# Patient Record
Sex: Female | Born: 1985 | Hispanic: No | Smoking: Never smoker
Health system: Southern US, Community
[De-identification: ages and names within clinical notes are randomized; demographics above are authoritative.]

## PROBLEM LIST (undated history)

## (undated) DIAGNOSIS — Z8619 Personal history of other infectious and parasitic diseases: Secondary | ICD-10-CM

## (undated) DIAGNOSIS — B009 Herpesviral infection, unspecified: Secondary | ICD-10-CM

## (undated) DIAGNOSIS — B379 Candidiasis, unspecified: Secondary | ICD-10-CM

## (undated) DIAGNOSIS — J45909 Unspecified asthma, uncomplicated: Secondary | ICD-10-CM

## (undated) DIAGNOSIS — N898 Other specified noninflammatory disorders of vagina: Principal | ICD-10-CM

## (undated) DIAGNOSIS — F329 Major depressive disorder, single episode, unspecified: Secondary | ICD-10-CM

## (undated) DIAGNOSIS — Z8742 Personal history of other diseases of the female genital tract: Secondary | ICD-10-CM

## (undated) DIAGNOSIS — N200 Calculus of kidney: Secondary | ICD-10-CM

## (undated) DIAGNOSIS — E785 Hyperlipidemia, unspecified: Secondary | ICD-10-CM

## (undated) DIAGNOSIS — E282 Polycystic ovarian syndrome: Secondary | ICD-10-CM

## (undated) DIAGNOSIS — R51 Headache: Secondary | ICD-10-CM

## (undated) DIAGNOSIS — E119 Type 2 diabetes mellitus without complications: Secondary | ICD-10-CM

## (undated) HISTORY — PX: INNER EAR SURGERY: SHX679

## (undated) HISTORY — DX: Hyperlipidemia, unspecified: E78.5

## (undated) HISTORY — DX: Personal history of other infectious and parasitic diseases: Z86.19

## (undated) HISTORY — PX: ORIF WRIST FRACTURE: SHX2133

## (undated) HISTORY — DX: Headache: R51

## (undated) HISTORY — PX: ARM HARDWARE REMOVAL: SUR1122

## (undated) HISTORY — DX: Unspecified asthma, uncomplicated: J45.909

## (undated) HISTORY — DX: Type 2 diabetes mellitus without complications: E11.9

## (undated) HISTORY — DX: Personal history of other diseases of the female genital tract: Z87.42

## (undated) HISTORY — DX: Major depressive disorder, single episode, unspecified: F32.9

## (undated) HISTORY — DX: Other specified noninflammatory disorders of vagina: N89.8

## (undated) HISTORY — DX: Candidiasis, unspecified: B37.9

---

## 2008-06-12 ENCOUNTER — Ambulatory Visit: Payer: Self-pay | Admitting: Internal Medicine

## 2008-06-12 DIAGNOSIS — A6000 Herpesviral infection of urogenital system, unspecified: Secondary | ICD-10-CM | POA: Insufficient documentation

## 2008-06-12 DIAGNOSIS — H9190 Unspecified hearing loss, unspecified ear: Secondary | ICD-10-CM | POA: Insufficient documentation

## 2008-06-12 DIAGNOSIS — J309 Allergic rhinitis, unspecified: Secondary | ICD-10-CM | POA: Insufficient documentation

## 2008-06-12 DIAGNOSIS — Z87442 Personal history of urinary calculi: Secondary | ICD-10-CM

## 2008-06-12 LAB — CONVERTED CEMR LAB
Bilirubin Urine: NEGATIVE
Glucose, Urine, Semiquant: NEGATIVE
Protein, U semiquant: 30
Urobilinogen, UA: 0.2
pH: 6.5

## 2008-06-16 LAB — CONVERTED CEMR LAB
Cholesterol: 233 mg/dL — ABNORMAL HIGH (ref 0–200)
HDL: 60 mg/dL (ref >39)
Total CHOL/HDL Ratio: 3.9
VLDL: 49 mg/dL — ABNORMAL HIGH (ref 0–40)

## 2008-06-17 ENCOUNTER — Other Ambulatory Visit: Admission: RE | Admit: 2008-06-17 | Discharge: 2008-06-17 | Payer: Self-pay | Admitting: Internal Medicine

## 2008-06-17 ENCOUNTER — Encounter (INDEPENDENT_AMBULATORY_CARE_PROVIDER_SITE_OTHER): Payer: Self-pay | Admitting: Internal Medicine

## 2008-06-17 ENCOUNTER — Ambulatory Visit: Payer: Self-pay | Admitting: Internal Medicine

## 2008-06-25 ENCOUNTER — Ambulatory Visit: Payer: Self-pay | Admitting: Internal Medicine

## 2008-06-26 ENCOUNTER — Ambulatory Visit: Payer: Self-pay | Admitting: Internal Medicine

## 2008-06-26 DIAGNOSIS — N920 Excessive and frequent menstruation with regular cycle: Secondary | ICD-10-CM

## 2008-06-26 LAB — CONVERTED CEMR LAB
Bilirubin Urine: NEGATIVE
Ketones, urine, test strip: NEGATIVE
Nitrite: NEGATIVE
Protein, U semiquant: NEGATIVE
Urobilinogen, UA: 0.2

## 2008-07-08 ENCOUNTER — Telehealth (INDEPENDENT_AMBULATORY_CARE_PROVIDER_SITE_OTHER): Payer: Self-pay | Admitting: *Deleted

## 2008-07-09 ENCOUNTER — Telehealth (INDEPENDENT_AMBULATORY_CARE_PROVIDER_SITE_OTHER): Payer: Self-pay | Admitting: Internal Medicine

## 2008-08-26 ENCOUNTER — Ambulatory Visit: Payer: Self-pay | Admitting: Internal Medicine

## 2008-08-26 LAB — CONVERTED CEMR LAB: Beta hcg, urine, semiquantitative: NEGATIVE

## 2008-08-27 LAB — CONVERTED CEMR LAB
BUN: 14 mg/dL (ref 6–23)
CO2: 23 meq/L (ref 19–32)
Creatinine, Ser: 0.67 mg/dL (ref 0.40–1.20)
Eosinophils Relative: 2 % (ref 0–5)
Glucose, Bld: 91 mg/dL (ref 70–99)
HCT: 39.6 % (ref 36.0–46.0)
Hemoglobin: 13.4 g/dL (ref 12.0–15.0)
Lymphocytes Relative: 46 % (ref 12–46)
Lymphs Abs: 3 10*3/uL (ref 0.7–4.0)
Monocytes Absolute: 0.7 10*3/uL (ref 0.1–1.0)
Monocytes Relative: 11 % (ref 3–12)
Preg, Serum: NEGATIVE
RBC: 4.55 M/uL (ref 3.87–5.11)
Sodium: 142 meq/L (ref 135–145)
Total Bilirubin: 0.3 mg/dL (ref 0.3–1.2)
Total Protein: 7.4 g/dL (ref 6.0–8.3)
WBC: 6.4 10*3/uL (ref 4.0–10.5)

## 2008-10-01 ENCOUNTER — Ambulatory Visit: Payer: Self-pay | Admitting: Internal Medicine

## 2008-10-01 ENCOUNTER — Telehealth (INDEPENDENT_AMBULATORY_CARE_PROVIDER_SITE_OTHER): Payer: Self-pay | Admitting: *Deleted

## 2008-12-02 ENCOUNTER — Telehealth (INDEPENDENT_AMBULATORY_CARE_PROVIDER_SITE_OTHER): Payer: Self-pay | Admitting: *Deleted

## 2008-12-11 ENCOUNTER — Ambulatory Visit: Payer: Self-pay | Admitting: Internal Medicine

## 2008-12-11 DIAGNOSIS — J020 Streptococcal pharyngitis: Secondary | ICD-10-CM

## 2008-12-11 LAB — CONVERTED CEMR LAB: Rapid Strep: POSITIVE

## 2008-12-21 ENCOUNTER — Telehealth (INDEPENDENT_AMBULATORY_CARE_PROVIDER_SITE_OTHER): Payer: Self-pay | Admitting: Internal Medicine

## 2008-12-21 ENCOUNTER — Ambulatory Visit: Payer: Self-pay | Admitting: Internal Medicine

## 2008-12-21 DIAGNOSIS — R109 Unspecified abdominal pain: Secondary | ICD-10-CM

## 2008-12-21 LAB — CONVERTED CEMR LAB
Bilirubin Urine: NEGATIVE
Ketones, urine, test strip: NEGATIVE
Urobilinogen, UA: 0.2
pH: 7

## 2009-01-06 ENCOUNTER — Ambulatory Visit: Payer: Self-pay | Admitting: Internal Medicine

## 2009-01-06 DIAGNOSIS — L708 Other acne: Secondary | ICD-10-CM

## 2009-01-06 DIAGNOSIS — L608 Other nail disorders: Secondary | ICD-10-CM | POA: Insufficient documentation

## 2009-01-07 ENCOUNTER — Encounter (INDEPENDENT_AMBULATORY_CARE_PROVIDER_SITE_OTHER): Payer: Self-pay | Admitting: Internal Medicine

## 2009-01-28 ENCOUNTER — Encounter (INDEPENDENT_AMBULATORY_CARE_PROVIDER_SITE_OTHER): Payer: Self-pay | Admitting: Internal Medicine

## 2009-03-08 ENCOUNTER — Telehealth (INDEPENDENT_AMBULATORY_CARE_PROVIDER_SITE_OTHER): Payer: Self-pay | Admitting: *Deleted

## 2009-03-09 ENCOUNTER — Ambulatory Visit: Payer: Self-pay | Admitting: Internal Medicine

## 2009-03-09 DIAGNOSIS — N760 Acute vaginitis: Secondary | ICD-10-CM | POA: Insufficient documentation

## 2009-03-10 ENCOUNTER — Telehealth (INDEPENDENT_AMBULATORY_CARE_PROVIDER_SITE_OTHER): Payer: Self-pay | Admitting: Internal Medicine

## 2009-03-10 LAB — CONVERTED CEMR LAB: Gardnerella vaginalis: POSITIVE — AB

## 2009-06-01 ENCOUNTER — Emergency Department (HOSPITAL_COMMUNITY): Admission: EM | Admit: 2009-06-01 | Discharge: 2009-06-01 | Payer: Self-pay | Admitting: Emergency Medicine

## 2009-12-18 ENCOUNTER — Emergency Department (HOSPITAL_COMMUNITY): Admission: EM | Admit: 2009-12-18 | Discharge: 2009-12-18 | Payer: Self-pay | Admitting: Emergency Medicine

## 2010-03-04 ENCOUNTER — Emergency Department (HOSPITAL_COMMUNITY): Admission: EM | Admit: 2010-03-04 | Discharge: 2010-03-04 | Payer: Self-pay | Admitting: Emergency Medicine

## 2010-07-21 ENCOUNTER — Emergency Department (HOSPITAL_COMMUNITY)
Admission: EM | Admit: 2010-07-21 | Discharge: 2010-07-21 | Payer: Self-pay | Source: Home / Self Care | Admitting: Emergency Medicine

## 2010-08-28 ENCOUNTER — Encounter: Payer: Self-pay | Admitting: Internal Medicine

## 2010-10-22 LAB — URINALYSIS, ROUTINE W REFLEX MICROSCOPIC
Bilirubin Urine: NEGATIVE
Specific Gravity, Urine: >1.03 — ABNORMAL HIGH (ref 1.005–1.030)
pH: 6 (ref 5.0–8.0)

## 2010-10-22 LAB — DIFFERENTIAL
Basophils Absolute: 0.1 10*3/uL (ref 0.0–0.1)
Basophils Relative: 1 % (ref 0–1)
Eosinophils Relative: 2 % (ref 0–5)
Lymphocytes Relative: 37 % (ref 12–46)
Monocytes Absolute: 0.9 10*3/uL (ref 0.1–1.0)

## 2010-10-22 LAB — COMPREHENSIVE METABOLIC PANEL
AST: 50 U/L — ABNORMAL HIGH (ref 0–37)
Albumin: 4.2 g/dL (ref 3.5–5.2)
Alkaline Phosphatase: 67 U/L (ref 39–117)
Chloride: 103 mEq/L (ref 96–112)
GFR calc Af Amer: >60 mL/min (ref >60)
Potassium: 3.6 mEq/L (ref 3.5–5.1)
Total Bilirubin: 0.8 mg/dL (ref 0.3–1.2)

## 2010-10-22 LAB — CBC
Platelets: 255 10*3/uL (ref 150–400)
RBC: 4.69 MIL/uL (ref 3.87–5.11)
WBC: 10.1 10*3/uL (ref 4.0–10.5)

## 2010-10-22 LAB — URINE MICROSCOPIC-ADD ON

## 2010-11-21 ENCOUNTER — Emergency Department (HOSPITAL_COMMUNITY)
Admission: EM | Admit: 2010-11-21 | Discharge: 2010-11-21 | Disposition: A | Discharge status: Home / Self Care | Payer: 59 | Attending: Emergency Medicine | Admitting: Emergency Medicine

## 2010-11-21 DIAGNOSIS — R319 Hematuria, unspecified: Secondary | ICD-10-CM | POA: Insufficient documentation

## 2010-11-21 DIAGNOSIS — E669 Obesity, unspecified: Secondary | ICD-10-CM | POA: Insufficient documentation

## 2010-11-21 DIAGNOSIS — J45909 Unspecified asthma, uncomplicated: Secondary | ICD-10-CM | POA: Insufficient documentation

## 2010-11-21 DIAGNOSIS — R109 Unspecified abdominal pain: Secondary | ICD-10-CM | POA: Insufficient documentation

## 2010-11-21 DIAGNOSIS — R300 Dysuria: Secondary | ICD-10-CM | POA: Insufficient documentation

## 2010-11-21 DIAGNOSIS — N760 Acute vaginitis: Secondary | ICD-10-CM | POA: Insufficient documentation

## 2010-11-21 DIAGNOSIS — N390 Urinary tract infection, site not specified: Secondary | ICD-10-CM | POA: Insufficient documentation

## 2010-11-21 LAB — WET PREP, GENITAL: Yeast Wet Prep HPF POC: NONE SEEN

## 2010-11-21 LAB — URINALYSIS, ROUTINE W REFLEX MICROSCOPIC
Bilirubin Urine: NEGATIVE
Nitrite: NEGATIVE
Specific Gravity, Urine: 1.026 (ref 1.005–1.030)
pH: 5 (ref 5.0–8.0)

## 2010-11-21 LAB — RPR: RPR Ser Ql: NONREACTIVE

## 2010-11-21 LAB — URINE MICROSCOPIC-ADD ON

## 2010-11-21 LAB — POCT PREGNANCY, URINE: Preg Test, Ur: NEGATIVE

## 2010-11-22 LAB — GC/CHLAMYDIA PROBE AMP, GENITAL
Chlamydia, DNA Probe: POSITIVE — AB
GC Probe Amp, Genital: NEGATIVE

## 2011-01-12 ENCOUNTER — Emergency Department (HOSPITAL_COMMUNITY)
Admission: EM | Admit: 2011-01-12 | Discharge: 2011-01-12 | Disposition: A | Discharge status: Home / Self Care | Payer: 59 | Attending: Emergency Medicine | Admitting: Emergency Medicine

## 2011-01-12 DIAGNOSIS — J45909 Unspecified asthma, uncomplicated: Secondary | ICD-10-CM | POA: Insufficient documentation

## 2011-01-12 DIAGNOSIS — R51 Headache: Secondary | ICD-10-CM | POA: Insufficient documentation

## 2011-04-03 ENCOUNTER — Emergency Department (HOSPITAL_COMMUNITY)
Admission: EM | Admit: 2011-04-03 | Discharge: 2011-04-03 | Disposition: A | Discharge status: Home / Self Care | Payer: 59 | Attending: Emergency Medicine | Admitting: Emergency Medicine

## 2011-04-03 ENCOUNTER — Encounter (HOSPITAL_COMMUNITY): Payer: Self-pay | Admitting: *Deleted

## 2011-04-03 DIAGNOSIS — R109 Unspecified abdominal pain: Secondary | ICD-10-CM | POA: Insufficient documentation

## 2011-04-03 DIAGNOSIS — Z888 Allergy status to other drugs, medicaments and biological substances status: Secondary | ICD-10-CM | POA: Insufficient documentation

## 2011-04-03 DIAGNOSIS — Z87442 Personal history of urinary calculi: Secondary | ICD-10-CM | POA: Insufficient documentation

## 2011-04-03 DIAGNOSIS — R319 Hematuria, unspecified: Secondary | ICD-10-CM | POA: Insufficient documentation

## 2011-04-03 DIAGNOSIS — IMO0001 Reserved for inherently not codable concepts without codable children: Secondary | ICD-10-CM | POA: Insufficient documentation

## 2011-04-03 DIAGNOSIS — Z880 Allergy status to penicillin: Secondary | ICD-10-CM | POA: Insufficient documentation

## 2011-04-03 DIAGNOSIS — M791 Myalgia, unspecified site: Secondary | ICD-10-CM

## 2011-04-03 DIAGNOSIS — R110 Nausea: Secondary | ICD-10-CM | POA: Insufficient documentation

## 2011-04-03 HISTORY — DX: Herpesviral infection, unspecified: B00.9

## 2011-04-03 HISTORY — DX: Calculus of kidney: N20.0

## 2011-04-03 LAB — URINE MICROSCOPIC-ADD ON

## 2011-04-03 LAB — URINALYSIS, ROUTINE W REFLEX MICROSCOPIC
Glucose, UA: NEGATIVE mg/dL
Ketones, ur: NEGATIVE mg/dL
Leukocytes, UA: NEGATIVE
Nitrite: NEGATIVE
Specific Gravity, Urine: 1.02 (ref 1.005–1.030)
Urobilinogen, UA: 0.2 mg/dL (ref 0.0–1.0)

## 2011-04-03 MED ORDER — KETOROLAC TROMETHAMINE 30 MG/ML IJ SOLN
30.0000 mg | Freq: Once | INTRAMUSCULAR | Status: AC
  Administered 2011-04-03: 30 mg via INTRAVENOUS
  Filled 2011-04-03: qty 1

## 2011-04-03 MED ORDER — ONDANSETRON HCL 4 MG/2ML IJ SOLN
4.0000 mg | Freq: Once | INTRAMUSCULAR | Status: AC
  Administered 2011-04-03: 4 mg via INTRAVENOUS
  Filled 2011-04-03: qty 2

## 2011-04-03 MED ORDER — CYCLOBENZAPRINE HCL 10 MG PO TABS
10.0000 mg | ORAL_TABLET | Freq: Once | ORAL | Status: AC
  Administered 2011-04-03: 10 mg via ORAL
  Filled 2011-04-03: qty 1

## 2011-04-03 MED ORDER — HYDROMORPHONE HCL 1 MG/ML IJ SOLN: 0.5000 mg | Freq: Once | INTRAMUSCULAR | Status: DC

## 2011-04-03 MED ORDER — CYCLOBENZAPRINE HCL 10 MG PO TABS: 10.0000 mg | ORAL_TABLET | Freq: Three times a day (TID) | ORAL | Status: AC | PRN

## 2011-04-03 MED ORDER — PROMETHAZINE HCL 25 MG/ML IJ SOLN
25.0000 mg | Freq: Once | INTRAMUSCULAR | Status: AC
  Administered 2011-04-03: 25 mg via INTRAVENOUS
  Filled 2011-04-03: qty 1

## 2011-04-03 MED ORDER — HYDROMORPHONE HCL 2 MG/ML IJ SOLN
INTRAMUSCULAR | Status: AC
  Administered 2011-04-03: 1 mg
  Filled 2011-04-03: qty 1

## 2011-04-03 MED ORDER — HYDROMORPHONE HCL 2 MG/ML IJ SOLN
INTRAMUSCULAR | Status: AC
  Administered 2011-04-03: 0.5 mg
  Filled 2011-04-03: qty 1

## 2011-04-03 MED ORDER — PROMETHAZINE HCL 25 MG PO TABS: 25.0000 mg | ORAL_TABLET | Freq: Four times a day (QID) | ORAL | Status: AC | PRN

## 2011-04-03 MED ORDER — HYDROMORPHONE HCL 1 MG/ML IJ SOLN: 1.0000 mg | Freq: Once | INTRAMUSCULAR | Status: DC

## 2011-04-03 MED ORDER — IBUPROFEN 600 MG PO TABS: 600.0000 mg | ORAL_TABLET | Freq: Four times a day (QID) | ORAL | Status: AC | PRN

## 2011-04-03 NOTE — ED Notes (Signed)
Pt c/o right flank pain and nausea since this am. States that she has been unable to void except in small amounts. Pt was seen in ED this am and told that she had kidney stones.

## 2011-04-03 NOTE — ED Notes (Signed)
Dilaudid 1mg   Given iv from a 2mg  vial. There were no 1 mg vials in pyxis

## 2011-04-03 NOTE — ED Provider Notes (Signed)
History     CSN: [id number] Arrival date & time: 04/03/2011  5:00 AM Pt seen at 0521 Chief Complaint  Patient presents with  . Flank Pain   HPI Comments: Pt reports acute onset of right flank pain just PTA No trauma No fever +nausea/vomiting No vaginal bleeding No dysuria Reports pain radiates around to abdomen  Reports distant h/o kidney stone, but no intervention required.   Patient is a 25 y.o. female presenting with flank pain. The history is provided by the patient.  Flank Pain Episode onset: just prior to arrival. The problem occurs constantly. The problem has been rapidly worsening. Pertinent negatives include no chest pain and no shortness of breath. The symptoms are aggravated by nothing. The symptoms are relieved by nothing. She has tried nothing for the symptoms.    Past Medical History  Diagnosis Date  . Kidney stones     History reviewed. No pertinent past surgical history.  No family history on file.  History  Substance Use Topics  . Smoking status: Never Smoker   . Smokeless tobacco: Not on file  . Alcohol Use: No    OB History    Grav Para Term Preterm Abortions TAB SAB Ect Mult Living                  Review of Systems  Respiratory: Negative for shortness of breath.   Cardiovascular: Negative for chest pain.  Genitourinary: Positive for flank pain.  All other systems reviewed and are negative.    Physical Exam  BP 125/90  Pulse 88  Temp(Src) 97.6 F (36.4 C) (Oral)  Resp 22  Ht 6\' 1"  (1.854 m)  Wt 300 lb (136.079 kg)  BMI 39.58 kg/m2  SpO2 100%  LMP 03/22/2011  Physical Exam  CONSTITUTIONAL: Well developed/well nourished HEAD AND FACE: Normocephalic/atraumatic EYES: EOMI/PERRL ENMT: Mucous membranes moist NECK: supple no meningeal signs CV: S1/S2 noted, no murmurs/rubs/gallops noted LUNGS: Lungs are clear to auscultation bilaterally, no apparent distress ABDOMEN: soft, nontender, no rebound or guarding WU:[id number] CVA  tenderness NEURO: Pt is awake/alert, moves all extremitiesx4 EXTREMITIES: pulses normal, full ROM SKIN: warm, color normal PSYCH: anxious   ED Course  Procedures  MDM All labs/vitals reviewed and considered Nursing notes reviewed and considered in documentation Previous records reviewed and considered   Pt with h/o kidney stones in past CT imaging from 2011 showed nonobstructing stones at that time Likely this is ureteral colic Pt improved, no vomiting, afebrile, pain improved Defer imaging at this time and advise close outpatient followup      Joya Gaskins, MD 04/03/11 [phone]

## 2011-04-03 NOTE — ED Notes (Signed)
Pt alert and oriented x 3. Skin warm and dry. Color pink. Breath sounds clear and equal bilaterally. Abdomen soft and non distended. Pt c/o right flank pain that has gotten worse since being seen in the ED this am. Pt also c/o nausea but denies vomiting. Pt rates pain 10/10 at this time.

## 2011-04-03 NOTE — ED Provider Notes (Signed)
Scribed for Ward Givens, MD, the patient was seen in room APA02/APA02 . This chart was scribed by Ellie Lunch. This patient's care was started at 5:45 PM.   CSN: [id number] Arrival date & time: 04/03/2011  5:27 PM  Chief Complaint  Patient presents with  . Flank Pain   HPI  Rachael Collier is a 25 y.o. female with a history of kidney stones presents to the Emergency Department complaining of right flank pain. Pt c/o flank pain with associated nausea this morning. Pt visited ED this morning and was told she told kidney stones. After being discharged, pt reports she had orange yellow urine and passed one stone about five hours ago (13:00). Pt reports pain improved for a little while after passing the stone, but then intensified at its worse 10/10, now 7/10 Pain is improved by laying down. Deep breathing and changing positions aggravates pain.  Pt denies vomiting and fever.  Pt states her last episodes of kidney stones was in 2007. Pt states she passed a stone earlier today that was white and indicates about 3 mm in size. Pt does drink a lot of caffeine, does daily milk ingestion.  Past Medical History  Diagnosis Date  . Kidney stones   . Herpes     anal    Past Surgical History  Procedure Date  . Inner ear surgery    MEDICATIONS:  Previous Medications   IBUPROFEN (ADVIL,MOTRIN) 600 MG TABLET    Take 1 tablet (600 mg total) by mouth every 6 (six) hours as needed for pain.     ALLERGIES:  Allergies as of 04/03/2011 - Review Complete 04/03/2011  Allergen Reaction Noted  . Penicillins Shortness Of Breath   . Hydrocodone-acetaminophen Other (See Comments)      History reviewed. No pertinent family history. Uncles on both sides have kidney stones  History  Substance Use Topics  . Smoking status: Never Smoker   . Smokeless tobacco: Not on file  . Alcohol Use: No  Hasn't had soda in months Drinks Tea regularly Drinks milk regularly  Review of Systems  Constitutional: Negative  for fever.  Gastrointestinal: Positive for nausea. Negative for vomiting.  Genitourinary: Positive for flank pain.  All other systems reviewed and are negative.    Physical Exam  BP 128/46  Pulse 74  Temp(Src) 97.5 F (36.4 C) (Oral)  Resp 20  Ht 6\' 1"  (1.854 m)  Wt 300 lb (136.079 kg)  BMI 39.58 kg/m2  SpO2 98%  LMP 03/22/2011  Physical Exam  Constitutional: She appears well-developed and well-nourished.       PT lying quietly on the stretcher, seems painful when she changes positions.   HENT:  Head: Normocephalic and atraumatic.  Eyes: Conjunctivae and EOM are normal. Pupils are equal, round, and reactive to light.  Neck: Normal range of motion.  Cardiovascular: Normal rate and regular rhythm.   Pulmonary/Chest: Effort normal and breath sounds normal.  Abdominal: Soft. She exhibits no mass. There is no tenderness. There is no rebound and no guarding.       Pt has pain to palpation in her right flank.   Musculoskeletal:       No acute abnormality.  Neurological: She is alert. She has normal strength.  Skin: Skin is warm, dry and intact. No bruising and no rash noted.  Psychiatric: She has a normal mood and affect. Her speech is normal and behavior is normal.     Procedures  OTHER DATA REVIEWED: Nursing notes, vital signs, and  past medical records reviewed.  Pt had [facility] CT done in July 2011 showing small bilateral renal stones with poss ovarian cyst. [place] of pelvis done same day and was normal.  DIAGNOSTIC STUDIES: Oxygen Saturation is 98% on room air, normal by my interpretation.     LABS: Results for orders placed during the hospital encounter of 04/03/11  URINALYSIS, ROUTINE W REFLEX MICROSCOPIC      Component Value Range   Color, Urine YELLOW  YELLOW    Appearance CLOUDY (*) CLEAR    Specific Gravity, Urine 1.020  1.005 - 1.030    pH 5.5  5.0 - 8.0    Glucose, UA NEGATIVE  NEGATIVE (mg/dL)   Hgb urine dipstick LARGE (*) NEGATIVE    Bilirubin Urine NEGATIVE   NEGATIVE    Ketones, ur NEGATIVE  NEGATIVE (mg/dL)   Protein, ur NEGATIVE  NEGATIVE (mg/dL)   Urobilinogen, UA 0.2  0.0 - 1.0 (mg/dL)   Nitrite NEGATIVE  NEGATIVE    Leukocytes, UA NEGATIVE  NEGATIVE   URINE MICROSCOPIC-ADD ON      Component Value Range   Squamous Epithelial / LPF FEW (*) RARE    WBC, UA 0-2  <3 (WBC/hpf)   RBC / HPF 21-50  <3 (RBC/hpf)   Bacteria, UA FEW (*) RARE     ED COURSE / COORDINATION OF CARE: 21:00 EDP at PT bedside. PT says she is improved and ready to go home. Pt received IV toradol, phenergan and oral flexeril and her pain is improved. She was discharged this morning with ibuprofen.  MDM: IMPRESSION: Diagnoses that have been ruled out:  Diagnoses that are still under consideration:  Final diagnoses:    CONDITION ON DISCHARGE: Stable  MEDICATIONS GIVEN IN THE E.D. Medications - No data to display  I personally performed the services described in this documentation, which was scribed in my presence. The recorded information has been reviewed and considered. Devoria Albe, MD, Armando Gang      Ward Givens, MD 04/03/11 2128

## 2011-04-03 NOTE — ED Notes (Signed)
Dr.fagan phoned

## 2011-04-03 NOTE — ED Notes (Signed)
Pt reports waking from sleep tonight w. Right flank pain

## 2011-04-03 NOTE — ED Notes (Signed)
Pt left the er stating no needs, pt to follow up with her pmd

## 2011-04-04 ENCOUNTER — Other Ambulatory Visit (HOSPITAL_COMMUNITY): Payer: Self-pay | Admitting: Urology

## 2011-04-04 ENCOUNTER — Ambulatory Visit (HOSPITAL_COMMUNITY)
Admission: RE | Admit: 2011-04-04 | Discharge: 2011-04-04 | Disposition: A | Discharge status: Home / Self Care | Payer: 59 | Source: Ambulatory Visit | Attending: Urology | Admitting: Urology

## 2011-04-04 DIAGNOSIS — R109 Unspecified abdominal pain: Secondary | ICD-10-CM | POA: Insufficient documentation

## 2011-04-04 DIAGNOSIS — N200 Calculus of kidney: Secondary | ICD-10-CM | POA: Insufficient documentation

## 2011-04-24 ENCOUNTER — Ambulatory Visit (HOSPITAL_COMMUNITY)
Admission: RE | Admit: 2011-04-24 | Discharge: 2011-04-24 | Disposition: A | Discharge status: Home / Self Care | Payer: 59 | Source: Ambulatory Visit | Attending: Internal Medicine | Admitting: Internal Medicine

## 2011-04-24 ENCOUNTER — Other Ambulatory Visit (HOSPITAL_COMMUNITY): Payer: Self-pay | Admitting: Internal Medicine

## 2011-04-24 DIAGNOSIS — M25569 Pain in unspecified knee: Secondary | ICD-10-CM | POA: Insufficient documentation

## 2011-04-24 DIAGNOSIS — W19XXXA Unspecified fall, initial encounter: Secondary | ICD-10-CM

## 2011-04-24 DIAGNOSIS — M25562 Pain in left knee: Secondary | ICD-10-CM

## 2011-05-21 ENCOUNTER — Ambulatory Visit: Payer: 59 | Attending: Internal Medicine | Admitting: Sleep Medicine

## 2011-05-21 DIAGNOSIS — G4730 Sleep apnea, unspecified: Secondary | ICD-10-CM | POA: Insufficient documentation

## 2011-05-21 DIAGNOSIS — G4710 Hypersomnia, unspecified: Secondary | ICD-10-CM | POA: Insufficient documentation

## 2011-05-27 NOTE — Procedures (Signed)
Rachael Collier, Rachael Collier            ACCOUNT NO.:  [id number]  MEDICAL RECORD NO.:  [id number]          PATIENT TYPE:  OUT  LOCATION:  SLEEP LAB                     FACILITY:  [facility]  PHYSICIAN:  Mitsuye Schrodt A. Gerilyn Pilgrim, M.D. DATE OF BIRTH:  1985/10/28  DATE OF STUDY:  05/21/2011                           NOCTURNAL POLYSOMNOGRAM  REFERRING PHYSICIAN:  ZACK HALL  REFERRING PHYSICIAN:  Dwana Melena, M.D.  RECORDING DATE:  05/21/2011.  INTERPRETING NEUROLOGIST:  Gery Sabedra A. Gerilyn Pilgrim, M.D.  INDICATIONS:  A 25 year old who presents with hypersomnia and Snoring suggustive of obstructive sleep apnea syndrome.  INDICATION FOR STUDY:  EPWORTH SLEEPINESS SCORE:  14..  BMI: 1.  Total recording time is 419 minutes.  Sleep efficiency is 94%, sleep latency 18 minutes.  REM latency is 138 minutes, Stage N1 2.7% and N2 49% and N3 30% and REM sleep 18%.    RESPIRATORY S6UMMARY: Baseline on oxygen saturation, is 96; lowest saturation 88 during non-REM sleep. Diagnostic AHI is 1.7 and RDI 2.3.  LIMB MOVEMENT SUMMARY:  PLM index 1.  ELECTROCARDIOGRAM SUMMARY:  The average heart rate is 69 with no significant dysrhythmia observed.  IMPRESSION:  Unremarkable nocturnal polysomnography.  RECOMMENDATIONS:  Given the increased Epworth Sleepiness Score, a Sleep consultation is recommended for further evaluation of the etiology of the hypersomnia.  Thanks for this referral.   Lejon Afzal A. Gerilyn Pilgrim, M.D.    KAD/MEDQ  D:  05/27/2011 16:10:96  T:  05/27/2011 21:58:19  Job:  [id number]

## 2011-07-03 ENCOUNTER — Ambulatory Visit (HOSPITAL_COMMUNITY)
Admission: RE | Admit: 2011-07-03 | Discharge: 2011-07-03 | Disposition: A | Discharge status: Home / Self Care | Payer: 59 | Source: Ambulatory Visit | Attending: Internal Medicine | Admitting: Internal Medicine

## 2011-07-03 DIAGNOSIS — R0602 Shortness of breath: Secondary | ICD-10-CM | POA: Insufficient documentation

## 2011-07-05 NOTE — Procedures (Signed)
NAME:  Rachael Collier, Rachael Collier            ACCOUNT NO.:  [id number]  MEDICAL RECORD NO.:  [id number]  LOCATION:  RESP                          FACILITY:  [facility]  PHYSICIAN:  Laymond Postle L. Juanetta Collier, M.D.DATE OF BIRTH:  [birth year]  DATE OF PROCEDURE:  07/05/2011 DATE OF DISCHARGE:  07/03/2011                           PULMONARY FUNCTION TEST   Reason for pulmonary function testing is shortness of breath. 1. Spirometry does not show a ventilatory defect or evidence of     airflow obstruction. 2. Lung volumes show mild restrictive change. 3. DLCO is moderately reduced, but does correct some for volume. 4. Noting the patient's height and weight some of the restrictive     abnormality maybe related to body habitus.     Rachael Collier, M.D.     ELH/MEDQ  D:  07/05/2011  T:  07/05/2011  Job:  [id number]  cc:   Catalina Pizza, M.D. Fax: [phone]

## 2011-08-09 ENCOUNTER — Emergency Department (HOSPITAL_COMMUNITY): Payer: 59

## 2011-08-09 ENCOUNTER — Emergency Department (HOSPITAL_COMMUNITY)
Admission: EM | Admit: 2011-08-09 | Discharge: 2011-08-09 | Disposition: A | Discharge status: Home / Self Care | Payer: 59 | Attending: Emergency Medicine | Admitting: Emergency Medicine

## 2011-08-09 ENCOUNTER — Encounter (HOSPITAL_COMMUNITY): Payer: Self-pay | Admitting: *Deleted

## 2011-08-09 DIAGNOSIS — B009 Herpesviral infection, unspecified: Secondary | ICD-10-CM | POA: Insufficient documentation

## 2011-08-09 DIAGNOSIS — R1031 Right lower quadrant pain: Secondary | ICD-10-CM | POA: Insufficient documentation

## 2011-08-09 DIAGNOSIS — N201 Calculus of ureter: Secondary | ICD-10-CM | POA: Insufficient documentation

## 2011-08-09 DIAGNOSIS — N200 Calculus of kidney: Secondary | ICD-10-CM

## 2011-08-09 LAB — URINALYSIS, ROUTINE W REFLEX MICROSCOPIC
Bilirubin Urine: NEGATIVE
Glucose, UA: NEGATIVE mg/dL
Ketones, ur: 40 mg/dL — AB
Leukocytes, UA: NEGATIVE
Protein, ur: 30 mg/dL — AB

## 2011-08-09 LAB — URINE MICROSCOPIC-ADD ON

## 2011-08-09 MED ORDER — CIPROFLOXACIN HCL 500 MG PO TABS: 500.0000 mg | ORAL_TABLET | Freq: Two times a day (BID) | ORAL | Status: AC

## 2011-08-09 MED ORDER — PROMETHAZINE HCL 25 MG PO TABS: 12.5000 mg | ORAL_TABLET | Freq: Four times a day (QID) | ORAL | Status: AC | PRN

## 2011-08-09 MED ORDER — KETOROLAC TROMETHAMINE 30 MG/ML IJ SOLN
30.0000 mg | Freq: Once | INTRAMUSCULAR | Status: AC
  Administered 2011-08-09: 30 mg via INTRAVENOUS
  Filled 2011-08-09: qty 1

## 2011-08-09 MED ORDER — SODIUM CHLORIDE 0.9 % IV BOLUS (SEPSIS)
1000.0000 mL | Freq: Once | INTRAVENOUS | Status: AC
  Administered 2011-08-09: 1000 mL via INTRAVENOUS

## 2011-08-09 MED ORDER — CIPROFLOXACIN HCL 250 MG PO TABS
500.0000 mg | ORAL_TABLET | Freq: Once | ORAL | Status: AC
  Administered 2011-08-09: 500 mg via ORAL
  Filled 2011-08-09: qty 2

## 2011-08-09 MED ORDER — HYDROMORPHONE HCL PF 1 MG/ML IJ SOLN
1.0000 mg | Freq: Once | INTRAMUSCULAR | Status: AC
  Administered 2011-08-09: 1 mg via INTRAVENOUS
  Filled 2011-08-09: qty 1

## 2011-08-09 MED ORDER — ONDANSETRON HCL 4 MG/2ML IJ SOLN
4.0000 mg | Freq: Once | INTRAMUSCULAR | Status: AC
  Administered 2011-08-09: 4 mg via INTRAVENOUS
  Filled 2011-08-09: qty 2

## 2011-08-09 MED ORDER — OXYCODONE-ACETAMINOPHEN 5-325 MG PO TABS: 1.0000 | ORAL_TABLET | ORAL | Status: AC | PRN

## 2011-08-09 NOTE — ED Notes (Signed)
Pt back in room from radiology.

## 2011-08-09 NOTE — ED Notes (Signed)
Pt reports she believes she has kidney stones.  States she is unable to urinate. Reports pain in right flank, radiating to front of abdomen. Reports she does have a history of kidney stones.

## 2011-08-09 NOTE — ED Provider Notes (Signed)
Scribed for [employer]. Colon Branch, MD, the patient was seen in room APA01/APA01 . This chart was scribed by Ellie Lunch.   CSN: [id number]  Arrival date & time 08/09/11  1656   First MD Initiated Contact with Patient 08/09/11 2037      Chief Complaint  Patient presents with  . Nephrolithiasis    (Consider location/radiation/quality/duration/timing/severity/associated sxs/prior treatment) HPI Pt seen at 9:13 PM Rachael Collier is a 26 y.o. female with a history of kidney stones who presents to the Emergency Department complaining of sudden onset right side pain. Pain began this morning. Pain radiates to right leg and across lower abdomen. Pain is associated with dysuria and nausea. Pain is similar to previous stones. Pt has been able to pass stones in the past. Pt has seen Dr. Frann Rider previously for stones. LNMP 07/21/2011.  PCP Dr. Margo Aye   Past Medical History  Diagnosis Date  . Kidney stones   . Herpes     anal    Past Surgical History  Procedure Date  . Inner ear surgery     History reviewed. No pertinent family history.  History  Substance Use Topics  . Smoking status: Never Smoker   . Smokeless tobacco: Not on file  . Alcohol Use: No    Review of Systems 10 Systems reviewed and are negative for acute change except as noted in the HPI.   Allergies  Penicillins; Bee venom; and Hydrocodone-acetaminophen  Home Medications   Current Outpatient Rx  Name Route Sig Dispense Refill  . IBUPROFEN 200 MG PO TABS Oral Take 200-800 mg by mouth 2 (two) times daily as needed. For pain. Take 4 on first dose, then 2 on second dose as needed for  pain     . OXYCODONE-ACETAMINOPHEN 5-325 MG PO TABS Oral Take 0.5-1 tablets by mouth once as needed. For pain     . ALBUTEROL IN Inhalation Inhale 2 puffs into the lungs every 4 (four) hours as needed. For breathing difficulty       BP 130/80  Pulse 76  Temp(Src) 99.4 F (37.4 C) (Oral)  Resp 20  Ht 6\' 1"  (1.854 m)  Wt 300 lb  (136.079 kg)  BMI 39.58 kg/m2  SpO2 99%  LMP 07/21/2011  Physical Exam  Nursing note and vitals reviewed. Constitutional: She is oriented to person, place, and time. She appears well-developed and well-nourished.  HENT:  Head: Normocephalic and atraumatic.  Eyes: EOM are normal. Pupils are equal, round, and reactive to light.  Neck: Normal range of motion. Neck supple.  Cardiovascular: Normal rate, regular rhythm and normal heart sounds.   Pulmonary/Chest: Effort normal and breath sounds normal. No respiratory distress.  Abdominal: She exhibits distension (suprapubic distension). There is tenderness. There is rebound. There is no guarding.       Right CVA tenderness  RLQ tenderness  Musculoskeletal: Normal range of motion.  Neurological: She is alert and oriented to person, place, and time.  Skin: Skin is warm and dry.  Psychiatric: She has a normal mood and affect.    ED Course  Procedures (including critical care time) DIAGNOSTIC STUDIES: Oxygen Saturation is 99% on room air, normal by my interpretation.    COORDINATION OF CARE:  Results for orders placed during the hospital encounter of 08/09/11  URINALYSIS, ROUTINE W REFLEX MICROSCOPIC      Component Value Range   Color, Urine AMBER (*) YELLOW    APPearance CLOUDY (*) CLEAR    Specific Gravity, Urine 1.025  1.005 - 1.030  pH 6.0  5.0 - 8.0    Glucose, UA NEGATIVE  NEGATIVE (mg/dL)   Hgb urine dipstick LARGE (*) NEGATIVE    Bilirubin Urine NEGATIVE  NEGATIVE    Ketones, ur 40 (*) NEGATIVE (mg/dL)   Protein, ur 30 (*) NEGATIVE (mg/dL)   Urobilinogen, UA 0.2  0.0 - 1.0 (mg/dL)   Nitrite NEGATIVE  NEGATIVE    Leukocytes, UA NEGATIVE  NEGATIVE   PREGNANCY, URINE      Component Value Range   Preg Test, Ur NEGATIVE    URINE MICROSCOPIC-ADD ON      Component Value Range   Squamous Epithelial / LPF MANY (*) RARE    WBC, UA 0-2  <3 (WBC/hpf)   RBC / HPF TOO NUMEROUS TO COUNT  <3 (RBC/hpf)   Bacteria, UA MANY (*) RARE     Ct Abdomen Pelvis Wo Contrast  08/09/2011  *RADIOLOGY REPORT*  Clinical Data: Right lower quadrant pain.  History of nephrolithiasis.  Difficulty urinating.  CT ABDOMEN AND PELVIS WITHOUT CONTRAST  Technique:  Multidetector CT imaging of the abdomen and pelvis was performed following the standard protocol without intravenous contrast.  Comparison: 03/04/2010  Findings: The lung bases are clear.  There is a 7 mm calculus in the distal right ureter at about the level of the sacrum with proximal pyelocaliectasis and ureterectasis as well as periureteral and pararenal stranding. This is consistent with moderate obstruction.  The distal ureter is decompressed and no additional stones are visualized. No pyelocaliectasis or ureterectasis on the left.  The bladder is decompressed.  No intrarenal stones are visualized.  Unenhanced appearance of the liver, spleen, gallbladder, pancreas, adrenal glands, abdominal aorta, and retroperitoneal lymph nodes is unremarkable.  Retroaortic left renal vein.  Stomach and small bowel are decompressed.  No free air or free fluid in the abdomen. Minimal fat containing umbilical hernia.  Pelvis:  Uterus and adnexal structures are not enlarged.  No free or loculated pelvic fluid collections.  No inflammatory changes in the sigmoid colon.  The colon is stool-filled with generalized decompression.  The appendix is normal.  Normal alignment of the lumbar vertebra.  Schmorl's node and limbus vertebrae of the superior endplate of L3.  IMPRESSION: 7 mm moderately obstructing stone in the distal right ureter at about the level of the sacrum.  Original Report Authenticated By: Marlon Pel, M.D.    ED MEDICATIONS Medications  ketorolac (TORADOL) 30 MG/ML injection 30 mg (30 mg Intravenous Given 08/09/11 2122)  HYDROmorphone (DILAUDID) injection 1 mg (1 mg Intravenous Given 08/09/11 2122)  ondansetron (ZOFRAN) injection 4 mg (4 mg Intravenous Given 08/09/11 2122)  sodium chloride 0.9 %  bolus 1,000 mL (1000 mL Intravenous Given 08/09/11 2122)        MDM  P atient with h/o kidney stones here with right sided pain and inability to urinate since this morning. CT with 7 mm stone at distal right ureter with periureteral and pararenal stranding. Given IVF, analgesic, antiemetic with relief. Initiated antibiotic therapy. Pt feels improved after observation and/or treatment in ED. Pt stable in ED with no significant deterioration in condition.The patient appears reasonably screened and/or stabilized for discharge and I doubt any other medical condition or other [facility] requiring further screening, evaluation, or treatment in the ED at this time prior to discharge.  I personally performed the services described in this documentation, which was scribed in my presence. The recorded information has been reviewed and considered.  MDM Reviewed: nursing note and vitals Interpretation: labs and  CT scan         Nicoletta Dress. Colon Branch, MD 08/09/11 2326

## 2011-08-09 NOTE — ED Notes (Signed)
MD at bedside. 

## 2011-08-11 ENCOUNTER — Ambulatory Visit (HOSPITAL_COMMUNITY)
Admission: RE | Admit: 2011-08-11 | Discharge: 2011-08-11 | Disposition: A | Discharge status: Home / Self Care | Payer: 59 | Source: Ambulatory Visit | Attending: Urology | Admitting: Urology

## 2011-08-11 ENCOUNTER — Other Ambulatory Visit (HOSPITAL_COMMUNITY): Payer: Self-pay | Admitting: Urology

## 2011-08-11 DIAGNOSIS — N201 Calculus of ureter: Secondary | ICD-10-CM

## 2011-08-11 DIAGNOSIS — R109 Unspecified abdominal pain: Secondary | ICD-10-CM | POA: Insufficient documentation

## 2011-10-01 ENCOUNTER — Encounter (HOSPITAL_COMMUNITY): Payer: Self-pay | Admitting: Emergency Medicine

## 2011-10-01 ENCOUNTER — Emergency Department (HOSPITAL_COMMUNITY): Payer: Managed Care, Other (non HMO)

## 2011-10-01 ENCOUNTER — Emergency Department (HOSPITAL_COMMUNITY)
Admission: EM | Admit: 2011-10-01 | Discharge: 2011-10-01 | Disposition: A | Discharge status: Home / Self Care | Payer: Managed Care, Other (non HMO) | Attending: Emergency Medicine | Admitting: Emergency Medicine

## 2011-10-01 DIAGNOSIS — N23 Unspecified renal colic: Secondary | ICD-10-CM

## 2011-10-01 DIAGNOSIS — N201 Calculus of ureter: Secondary | ICD-10-CM

## 2011-10-01 DIAGNOSIS — R300 Dysuria: Secondary | ICD-10-CM | POA: Insufficient documentation

## 2011-10-01 DIAGNOSIS — Z87442 Personal history of urinary calculi: Secondary | ICD-10-CM | POA: Insufficient documentation

## 2011-10-01 DIAGNOSIS — R110 Nausea: Secondary | ICD-10-CM | POA: Insufficient documentation

## 2011-10-01 DIAGNOSIS — R109 Unspecified abdominal pain: Secondary | ICD-10-CM | POA: Insufficient documentation

## 2011-10-01 LAB — URINE MICROSCOPIC-ADD ON

## 2011-10-01 LAB — URINALYSIS, ROUTINE W REFLEX MICROSCOPIC
Ketones, ur: NEGATIVE mg/dL
Leukocytes, UA: NEGATIVE
Nitrite: NEGATIVE
pH: 5.5 (ref 5.0–8.0)

## 2011-10-01 LAB — POCT PREGNANCY, URINE: Preg Test, Ur: NEGATIVE

## 2011-10-01 MED ORDER — CIPROFLOXACIN HCL 250 MG PO TABS
500.0000 mg | ORAL_TABLET | Freq: Once | ORAL | Status: AC
  Administered 2011-10-01: 500 mg via ORAL
  Filled 2011-10-01: qty 2

## 2011-10-01 MED ORDER — HYDROMORPHONE HCL PF 1 MG/ML IJ SOLN
1.0000 mg | Freq: Once | INTRAMUSCULAR | Status: AC
  Administered 2011-10-01: 1 mg via INTRAVENOUS
  Filled 2011-10-01: qty 1

## 2011-10-01 MED ORDER — ONDANSETRON HCL 4 MG PO TABS: 4.0000 mg | ORAL_TABLET | Freq: Four times a day (QID) | ORAL | Status: AC | PRN

## 2011-10-01 MED ORDER — ONDANSETRON HCL 4 MG/2ML IJ SOLN: 4.0000 mg | INTRAMUSCULAR | Status: DC | PRN

## 2011-10-01 MED ORDER — CIPROFLOXACIN HCL 250 MG PO TABS: 500.0000 mg | ORAL_TABLET | Freq: Once | ORAL | Status: DC

## 2011-10-01 MED ORDER — KETOROLAC TROMETHAMINE 30 MG/ML IJ SOLN
30.0000 mg | Freq: Once | INTRAMUSCULAR | Status: AC
  Administered 2011-10-01: 30 mg via INTRAVENOUS
  Filled 2011-10-01: qty 1

## 2011-10-01 MED ORDER — TAMSULOSIN HCL 0.4 MG PO CAPS: 0.4000 mg | ORAL_CAPSULE | Freq: Every day | ORAL | Status: DC

## 2011-10-01 MED ORDER — OXYCODONE-ACETAMINOPHEN 5-325 MG PO TABS: ORAL_TABLET | ORAL | Status: AC

## 2011-10-01 MED ORDER — CIPROFLOXACIN HCL 500 MG PO TABS: 500.0000 mg | ORAL_TABLET | Freq: Two times a day (BID) | ORAL | Status: AC

## 2011-10-01 NOTE — Discharge Instructions (Signed)
RESOURCE GUIDE  Dental Problems  Patients with Medicaid: [facility]                     [facility] [address]                                           [address] Phone:  [phone]                                                  Phone:  [phone]  If unable to pay or uninsured, contact:  [facility] or [facility] to become qualified for the adult dental clinic.  Chronic Pain Problems Contact [facility]  [phone] Patients need to be referred by their primary care doctor.  Insufficient Money for Medicine Contact [employer]:  call "211" or [facility] 271-5999.  No Primary Care Doctor Call [facility]  832-8000 Other agencies that provide inexpensive medical care    [facility]  [phone]    [facility]  [phone]    [facility]  [phone]    Women's Clinic  [phone]    [facility]  [phone]    [facility]  [phone]  Psychological Services [facility]  [phone] [facility]  [phone] [facility]   800 853-5163 (emergency services 641-4993)  Substance Abuse Resources [facility]  336-882-2125 [facility] 336-784-9470 [facility] 336-285-9073 [facility] 336-845-3988 Residential & Outpatient Substance Abuse Program  800-659-3381  Abuse/Neglect [place] Child Abuse Hotline (336) 641-3795 [place] Child Abuse Hotline 800-378-5315 (After Hours)  Emergency Shelter [facility] (336) 271-5985  Maternity Homes [facility] (336) 275-9566 [facility] (704) 372-4663  MRSA Hotline #:   832-7006    [place] Resources  [facility] [place]     [employer]                          [facility] [address] [place]                       [address]      [address]  [place]                                                [place]                            [place] Phone:  [phone]                                   Phone:  [phone]                 Phone:  [phone]  [facility] Phone:  [phone]    [facility] Child Abuse Hotline 859-203-6546 708-024-4787 (After Hours)    Take the prescriptions as directed.  Also take over the counter ibuprofen, 2 tablets by mouth every 4 hours with food, for the next week.  Call your regular medical doctor and the Urologist tomorrow morning to schedule a follow up appointment this week.  Return to the Emergency Department immediately if worsening.

## 2011-10-01 NOTE — ED Notes (Signed)
Pt c/o rt flank pain that radiates to her lower abd x 1 hour.

## 2011-10-01 NOTE — ED Provider Notes (Signed)
History     CSN: [id number]  Arrival date & time 10/01/11  1415   First MD Initiated Contact with Patient 10/01/11 1445      Chief Complaint  Patient presents with  . Flank Pain    HPI Pt was seen at 1450.  Per pt, c/o sudden onset and worsening of persistent right sided flank "pain" since this morning.  Describes the pain as "my kidney stone pain."  Has been assoc with radiation into the right side of her abd, nausea and dysuria.  Describes the dysuria as "only able to urinate a little bit at a time."  Denies vomiting/diarrhea, no fevers, no rash, no vaginal bleeding/discharge.  Pt has hx of kidney stones with multiple previous ED evals for same, with most recent last month.  States she f/u with Uro Dr. Rito Ehrlich but "didn't go back because I didn't like him."   Past Medical History  Diagnosis Date  . Kidney stones   . Herpes     anal    Past Surgical History  Procedure Date  . Inner ear surgery     History  Substance Use Topics  . Smoking status: Never Smoker   . Smokeless tobacco: Not on file  . Alcohol Use: No    Review of Systems ROS: Statement: All systems negative except as marked or noted in the HPI; Constitutional: Negative for fever and chills. ; ; Eyes: Negative for eye pain, redness and discharge. ; ; ENMT: Negative for ear pain, hoarseness, nasal congestion, sinus pressure and sore throat. ; ; Cardiovascular: Negative for chest pain, palpitations, diaphoresis, dyspnea and peripheral edema. ; ; Respiratory: Negative for cough, wheezing and stridor. ; ; Gastrointestinal: +nausea. Negative for vomiting, diarrhea, abdominal pain, blood in stool, hematemesis, jaundice and rectal bleeding. . ; ; Genitourinary: +right flank pain, dysuria. Negative for hematuria. ; ; Musculoskeletal: Negative for neck pain. Negative for swelling and trauma.; ; Skin: Negative for pruritus, rash, abrasions, blisters, bruising and skin lesion.; ; Neuro: Negative for headache, lightheadedness and  neck stiffness. Negative for weakness, altered level of consciousness , altered mental status, extremity weakness, paresthesias, involuntary movement, seizure and syncope.     Allergies  Penicillins; Bee venom; and Hydrocodone-acetaminophen  Home Medications   Current Outpatient Rx  Name Route Sig Dispense Refill  . ALBUTEROL IN Inhalation Inhale 2 puffs into the lungs every 4 (four) hours as needed. For breathing difficulty     . IBUPROFEN 200 MG PO TABS Oral Take 200-800 mg by mouth 2 (two) times daily as needed. For pain. Take 4 on first dose, then 2 on second dose as needed for  pain     . OXYCODONE-ACETAMINOPHEN 5-325 MG PO TABS Oral Take 0.5-1 tablets by mouth once as needed. For pain       BP 142/82  Pulse 99  Temp(Src) 97.5 F (36.4 C) (Oral)  Resp 23  Ht 6\' 1"  (1.854 m)  Wt 300 lb (136.079 kg)  BMI 39.58 kg/m2  SpO2 100%  LMP 09/21/2011  Physical Exam 1455: Physical examination:  Nursing notes reviewed; Vital signs and O2 SAT reviewed;  Constitutional: Well developed, Well nourished, Well hydrated, Uncomfortable appearing; Head:  Normocephalic, atraumatic; Eyes: EOMI, PERRL, No scleral icterus; ENMT: Mouth and pharynx normal, Mucous membranes moist; Neck: Supple, Full range of motion, No lymphadenopathy; Cardiovascular: Regular rate and rhythm, No murmur or gallop; Respiratory: Breath sounds clear & equal bilaterally, No rales, rhonchi, wheezes, Normal respiratory effort/excursion; Chest: Nontender, Movement normal; Abdomen: Soft, +mild suprapubic  tenderness to palp, no rebound or guarding. Nondistended, Normal bowel sounds; Genitourinary: No CVA tenderness; Spine:  No midline CS, TS, LS tenderness.; Extremities: Pulses normal, No tenderness, No edema, No calf edema or asymmetry.; Neuro: AA&Ox3, Major CN grossly intact.  No gross focal motor or sensory deficits in extremities.; Skin: Color normal, Warm, Dry, no rash.    ED Course  Procedures  MDM  MDM Reviewed: previous  chart, nursing note and vitals Reviewed previous: CT scan and x-ray Interpretation: x-ray and labs   Results for orders placed during the hospital encounter of 10/01/11  URINALYSIS, ROUTINE W REFLEX MICROSCOPIC      Component Value Range   Color, Urine YELLOW  YELLOW    APPearance CLOUDY (*) CLEAR    Specific Gravity, Urine >1.030 (*) 1.005 - 1.030    pH 5.5  5.0 - 8.0    Glucose, UA NEGATIVE  NEGATIVE (mg/dL)   Hgb urine dipstick MODERATE (*) NEGATIVE    Bilirubin Urine NEGATIVE  NEGATIVE    Ketones, ur NEGATIVE  NEGATIVE (mg/dL)   Protein, ur NEGATIVE  NEGATIVE (mg/dL)   Urobilinogen, UA 0.2  0.0 - 1.0 (mg/dL)   Nitrite NEGATIVE  NEGATIVE    Leukocytes, UA NEGATIVE  NEGATIVE   POCT PREGNANCY, URINE      Component Value Range   Preg Test, Ur NEGATIVE  NEGATIVE   URINE MICROSCOPIC-ADD ON      Component Value Range   Squamous Epithelial / LPF MANY (*) RARE    WBC, UA 7-10  <3 (WBC/hpf)   RBC / HPF 7-10  <3 (RBC/hpf)   Bacteria, UA MANY (*) RARE    Dg Abd 1 View 10/01/2011  *RADIOLOGY REPORT*  Clinical Data: Prior history of urinary tract calculi, presenting now with right flank pain.  ABDOMEN - 1 VIEW 10/01/2011:  Comparison: One-view abdomen x-ray 08/11/2011.  CT abdomen and pelvis 08/09/2011, 03/04/2010.  Findings: Approximate 6 mm calcification in the right side of the pelvis, not present on the prior abdominal image, consistent with a distal right ureteral calculus.  No other visible opaque urinary tract calculi on either side.  Mild gaseous distention of a few loops of small bowel in the left upper quadrant.  Moderate stool burden.  Regional skeleton unremarkable.  IMPRESSION: Approximate 6 mm calculus in the distal right ureter.  Probable mild small bowel ileus in the left upper quadrant.  Original Report Authenticated By: Arnell Sieving, M.D.    6:39 PM:  Pt states she "feels better now" and wants to go home.  Appears comfortable now after meds.  Endorses after last ED  visit for 7mm ureteral calculi, the Uro MD "told me I passed it" on f/u XR.  Will add UC to labs.  Has tol PO well while in ED.  Dx testing d/w pt.  Questions answered.  Verb understanding, agreeable to d/c home with outpt f/u.           Laray Anger, DO 10/03/11 2135

## 2011-10-01 NOTE — ED Notes (Signed)
Pt alert & oriented x4, stable gait. Pt given discharge instructions, paperwork & prescription(s). Patient instructed to stop at the registration desk to finish any additional paperwork. pt verbalized understanding. Pt left department w/ no further questions.  

## 2011-10-03 LAB — URINE CULTURE

## 2011-10-04 NOTE — ED Notes (Signed)
+   urine Patient treated with Cipro-sensitive to same-chart appended per protocol MD. 

## 2011-12-22 ENCOUNTER — Emergency Department (HOSPITAL_COMMUNITY)
Admission: EM | Admit: 2011-12-22 | Discharge: 2011-12-22 | Disposition: A | Discharge status: Home / Self Care | Payer: Managed Care, Other (non HMO) | Attending: Emergency Medicine | Admitting: Emergency Medicine

## 2011-12-22 ENCOUNTER — Encounter (HOSPITAL_COMMUNITY): Payer: Self-pay | Admitting: *Deleted

## 2011-12-22 DIAGNOSIS — R10819 Abdominal tenderness, unspecified site: Secondary | ICD-10-CM | POA: Insufficient documentation

## 2011-12-22 DIAGNOSIS — R109 Unspecified abdominal pain: Secondary | ICD-10-CM | POA: Insufficient documentation

## 2011-12-22 DIAGNOSIS — R300 Dysuria: Secondary | ICD-10-CM | POA: Insufficient documentation

## 2011-12-22 DIAGNOSIS — Z87442 Personal history of urinary calculi: Secondary | ICD-10-CM | POA: Insufficient documentation

## 2011-12-22 LAB — URINALYSIS, ROUTINE W REFLEX MICROSCOPIC
Bilirubin Urine: NEGATIVE
Ketones, ur: NEGATIVE mg/dL
Nitrite: NEGATIVE
Urobilinogen, UA: 0.2 mg/dL (ref 0.0–1.0)

## 2011-12-22 MED ORDER — OXYCODONE-ACETAMINOPHEN 5-325 MG PO TABS
1.0000 | ORAL_TABLET | Freq: Once | ORAL | Status: AC
  Administered 2011-12-22: 1 via ORAL
  Filled 2011-12-22: qty 1

## 2011-12-22 MED ORDER — ONDANSETRON HCL 4 MG PO TABS
4.0000 mg | ORAL_TABLET | Freq: Once | ORAL | Status: AC
  Administered 2011-12-22: 4 mg via ORAL
  Filled 2011-12-22: qty 1

## 2011-12-22 MED ORDER — HYDROCODONE-ACETAMINOPHEN 5-325 MG PO TABS
1.0000 | ORAL_TABLET | Freq: Once | ORAL | Status: DC
  Filled 2011-12-22: qty 1

## 2011-12-22 NOTE — ED Provider Notes (Signed)
History     CSN: [id number]  Arrival date & time 12/22/11  0400   First MD Initiated Contact with Patient 12/22/11 [phone]      Chief Complaint  Patient presents with  . Abdominal Pain    (Consider location/radiation/quality/duration/timing/severity/associated sxs/prior treatment) HPI Rachael Collier is a 26 y.o. female who presents to the Emergency Department complaining of lower sharp cramping abdominal pain that began an hour PTA. It is associated with dysuria. Denies fever, chills, vomiting, diarrhea.  PCP Dr. Margo Aye   Past Medical History  Diagnosis Date  . Kidney stones   . Herpes     anal    Past Surgical History  Procedure Date  . Inner ear surgery     No family history on file.  History  Substance Use Topics  . Smoking status: Never Smoker   . Smokeless tobacco: Not on file  . Alcohol Use: Yes    OB History    Grav Para Term Preterm Abortions TAB SAB Ect Mult Living                  Review of Systems  Constitutional: Negative for fever.       10 Systems reviewed and are negative for acute change except as noted in the HPI.  HENT: Negative for congestion.   Eyes: Negative for discharge and redness.  Respiratory: Negative for cough and shortness of breath.   Cardiovascular: Negative for chest pain.  Gastrointestinal: Positive for abdominal pain. Negative for vomiting.  Musculoskeletal: Negative for back pain.  Skin: Negative for rash.  Neurological: Negative for syncope, numbness and headaches.  Psychiatric/Behavioral:       No behavior change.    Allergies  Penicillins; Bee venom; and Hydrocodone-acetaminophen  Home Medications   Current Outpatient Rx  Name Route Sig Dispense Refill  . ALBUTEROL SULFATE HFA 108 (90 BASE) MCG/ACT IN AERS Inhalation Inhale 2 puffs into the lungs every 6 (six) hours as needed. Shortness of breath    . IBUPROFEN 200 MG PO TABS Oral Take 200-800 mg by mouth 2 (two) times daily as needed. For pain. Take 4 on first  dose, then 2 on second dose as needed for  pain     . TAMSULOSIN HCL 0.4 MG PO CAPS Oral Take 1 capsule (0.4 mg total) by mouth at bedtime. 5 capsule 0    BP 113/82  Pulse 87  Temp(Src) 98.2 F (36.8 C) (Oral)  Resp 22  Ht 6\' 1"  (1.854 m)  Wt 300 lb (136.079 kg)  BMI 39.58 kg/m2  SpO2 96%  LMP 11/19/2011  Physical Exam  Nursing note and vitals reviewed. Constitutional:       Awake, alert, nontoxic appearance.  HENT:  Head: Atraumatic.  Eyes: Right eye exhibits no discharge. Left eye exhibits no discharge.  Neck: Neck supple.  Cardiovascular: Normal rate, normal heart sounds and intact distal pulses.   Pulmonary/Chest: Effort normal. She exhibits no tenderness.  Abdominal: Soft. Bowel sounds are normal. She exhibits no distension. There is tenderness. There is no rebound and no guarding.       Mild lower abdominal tenderness to palpation.  Genitourinary:       No cva tenderness  Musculoskeletal: She exhibits no tenderness.       Baseline ROM, no obvious new focal weakness.  Neurological:       Mental status and motor strength appears baseline for patient and situation.  Skin: No rash noted.  Psychiatric: She has a normal mood and affect.  ED Course  Procedures (including critical care time)  Results for orders placed during the hospital encounter of 12/22/11  URINALYSIS, ROUTINE W REFLEX MICROSCOPIC      Component Value Range   Color, Urine YELLOW  YELLOW    APPearance CLEAR  CLEAR    Specific Gravity, Urine 1.025  1.005 - 1.030    pH 6.0  5.0 - 8.0    Glucose, UA NEGATIVE  NEGATIVE (mg/dL)   Hgb urine dipstick NEGATIVE  NEGATIVE    Bilirubin Urine NEGATIVE  NEGATIVE    Ketones, ur NEGATIVE  NEGATIVE (mg/dL)   Protein, ur NEGATIVE  NEGATIVE (mg/dL)   Urobilinogen, UA 0.2  0.0 - 1.0 (mg/dL)   Nitrite NEGATIVE  NEGATIVE    Leukocytes, UA NEGATIVE  NEGATIVE   PREGNANCY, URINE      Component Value Range   Preg Test, Ur NEGATIVE  NEGATIVE      MDM  Patient  with onset of severe cramping lower abdominal pain an hour before arrival. Urine is negative. Patient was given anti-medic an analgesic with improvement.Pt stable in ED with no significant deterioration in condition.The patient appears reasonably screened and/or stabilized for discharge and I doubt any other medical condition or other [facility] requiring further screening, evaluation, or treatment in the ED at this time prior to discharge.  MDM Reviewed: nursing note and vitals Interpretation: labs           Nicoletta Dress. Colon Branch, MD 12/22/11 [id number]

## 2011-12-22 NOTE — ED Notes (Signed)
Pt states pelvic pain started about 2 hours ago. Pt denies any, fever, vomiting, or vaginal discharge. Pt next menstural cycle next week.

## 2011-12-22 NOTE — Discharge Instructions (Signed)
Your urine did not have any infection. The most likely cause of your pain is gas in the colon. Try to have a couple of bowel movements. Follow up with your doctor.   Abdominal Pain (Nonspecific) Your exam might not show the exact reason you have abdominal pain. Since there are many different causes of abdominal pain, another checkup and more tests may be needed. It is very important to follow up for lasting (persistent) or worsening symptoms. A possible cause of abdominal pain in any person who still has his or her appendix is acute appendicitis. Appendicitis is often hard to diagnose. Normal blood tests, urine tests, ultrasound, and CT scans do not completely rule out early appendicitis or other causes of abdominal pain. Sometimes, only the changes that happen over time will allow appendicitis and other causes of abdominal pain to be determined. Other potential problems that may require surgery may also take time to become more apparent. Because of this, it is important that you follow all of the instructions below. HOME CARE INSTRUCTIONS   Rest as much as possible.   Do not eat solid food until your pain is gone.   While adults or children have pain: A diet of water, weak decaffeinated tea, broth or bouillon, gelatin, oral rehydration solutions (ORS), frozen ice pops, or ice chips may be helpful.   When pain is gone in adults or children: Start a light diet (dry toast, crackers, applesauce, or white rice). Increase the diet slowly as long as it does not bother you. Eat no dairy products (including cheese and eggs) and no spicy, fatty, fried, or high-fiber foods.   Use no alcohol, caffeine, or cigarettes.   Take your regular medicines unless your caregiver told you not to.   Take any prescribed medicine as directed.   Only take over-the-counter or prescription medicines for pain, discomfort, or fever as directed by your caregiver. Do not give aspirin to children.  If your caregiver has given  you a follow-up appointment, it is very important to keep that appointment. Not keeping the appointment could result in a permanent injury and/or lasting (chronic) pain and/or disability. If there is any problem keeping the appointment, you must call to reschedule.  SEEK IMMEDIATE MEDICAL CARE IF:   Your pain is not gone in 24 hours.   Your pain becomes worse, changes location, or feels different.   You or your child has an oral temperature above 102 F (38.9 C), not controlled by medicine.   Your baby is older than 3 months with a rectal temperature of 102 F (38.9 C) or higher.   Your baby is 46 months old or younger with a rectal temperature of 100.4 F (38 C) or higher.   You have shaking chills.   You keep throwing up (vomiting) or cannot drink liquids.   There is blood in your vomit or you see blood in your bowel movements.   Your bowel movements become dark or black.   You have frequent bowel movements.   Your bowel movements stop (become blocked) or you cannot pass gas.   You have bloody, frequent, or painful urination.   You have yellow discoloration in the skin or whites of the eyes.   Your stomach becomes bloated or bigger.   You have dizziness or fainting.   You have chest or back pain.  MAKE SURE YOU:   Understand these instructions.   Will watch your condition.   Will get help right away if you are not  doing well or get worse.  Document Released: 07/24/2005 Document Revised: 07/13/2011 Document Reviewed: 06/21/2009 [facility] Patient Information 2012 [place], [place].

## 2011-12-22 NOTE — ED Notes (Signed)
Pt alert & oriented x4, stable gait. Pt given discharge instructions, paperwork & prescription(s). Patient instructed to stop at the registration desk to finish any additional paperwork. pt verbalized understanding. Pt left department w/ no further questions.  

## 2011-12-22 NOTE — ED Notes (Signed)
Pt reports onset of lower abd/pelvic pain starting 1 hr ago

## 2012-02-18 ENCOUNTER — Emergency Department (HOSPITAL_COMMUNITY)
Admission: EM | Admit: 2012-02-18 | Discharge: 2012-02-18 | Disposition: A | Discharge status: Home / Self Care | Payer: Managed Care, Other (non HMO) | Attending: Emergency Medicine | Admitting: Emergency Medicine

## 2012-02-18 ENCOUNTER — Encounter (HOSPITAL_COMMUNITY): Payer: Self-pay | Admitting: *Deleted

## 2012-02-18 ENCOUNTER — Emergency Department (HOSPITAL_COMMUNITY): Payer: Managed Care, Other (non HMO)

## 2012-02-18 DIAGNOSIS — R1031 Right lower quadrant pain: Secondary | ICD-10-CM | POA: Insufficient documentation

## 2012-02-18 DIAGNOSIS — Z79899 Other long term (current) drug therapy: Secondary | ICD-10-CM | POA: Insufficient documentation

## 2012-02-18 DIAGNOSIS — R109 Unspecified abdominal pain: Secondary | ICD-10-CM

## 2012-02-18 LAB — URINALYSIS, ROUTINE W REFLEX MICROSCOPIC
Bilirubin Urine: NEGATIVE
Glucose, UA: 250 mg/dL — AB
Ketones, ur: NEGATIVE mg/dL
Protein, ur: NEGATIVE mg/dL

## 2012-02-18 LAB — URINE MICROSCOPIC-ADD ON

## 2012-02-18 MED ORDER — ONDANSETRON HCL 8 MG PO TABS: 8.0000 mg | ORAL_TABLET | ORAL | Status: DC | PRN

## 2012-02-18 MED ORDER — SODIUM CHLORIDE 0.9 % IV SOLN
INTRAVENOUS | Status: DC
  Administered 2012-02-18: 1000 mL via INTRAVENOUS

## 2012-02-18 MED ORDER — TRAMADOL HCL 50 MG PO TABS: 20.0000 mg | ORAL_TABLET | Freq: Four times a day (QID) | ORAL | Status: DC | PRN

## 2012-02-18 MED ORDER — SODIUM CHLORIDE 0.9 % IV BOLUS (SEPSIS)
500.0000 mL | Freq: Once | INTRAVENOUS | Status: AC
  Administered 2012-02-18: 500 mL via INTRAVENOUS

## 2012-02-18 MED ORDER — KETOROLAC TROMETHAMINE 30 MG/ML IJ SOLN
30.0000 mg | Freq: Once | INTRAMUSCULAR | Status: AC
  Administered 2012-02-18: 30 mg via INTRAVENOUS
  Filled 2012-02-18: qty 1

## 2012-02-18 MED ORDER — TAMSULOSIN HCL 0.4 MG PO CAPS: 0.4000 mg | ORAL_CAPSULE | Freq: Every day | ORAL | Status: DC

## 2012-02-18 MED ORDER — HYDROMORPHONE HCL PF 1 MG/ML IJ SOLN
1.0000 mg | Freq: Once | INTRAMUSCULAR | Status: AC
  Administered 2012-02-18: 1 mg via INTRAVENOUS
  Filled 2012-02-18: qty 1

## 2012-02-18 MED ORDER — ONDANSETRON HCL 4 MG/2ML IJ SOLN
4.0000 mg | Freq: Once | INTRAMUSCULAR | Status: AC
  Administered 2012-02-18: 4 mg via INTRAVENOUS
  Filled 2012-02-18: qty 2

## 2012-02-18 NOTE — ED Provider Notes (Addendum)
History     CSN: [id number]  Arrival date & time 02/18/12  0050   First MD Initiated Contact with Patient 02/18/12 0102      Chief Complaint  Patient presents with  . Flank Pain    (Consider location/radiation/quality/duration/timing/severity/associated sxs/prior treatment) HPI.... right flank pain with radiation to right lower quadrant x2 hours. Route onset. Patient has history of kidney stones. Stone 2 months ago. No fever, sweats, chills, dysuria, hematuria. Nothing Makes symptoms better or worse.  Pain is sharp and cramping  Past Medical History  Diagnosis Date  . Kidney stones   . Herpes     anal    Past Surgical History  Procedure Date  . Inner ear surgery   . Arm hardware removal     No family history on file.  History  Substance Use Topics  . Smoking status: Never Smoker   . Smokeless tobacco: Not on file  . Alcohol Use: Yes    OB History    Grav Para Term Preterm Abortions TAB SAB Ect Mult Living                  Review of Systems  All other systems reviewed and are negative.    Allergies  Penicillins; Bee venom; and Hydrocodone-acetaminophen  Home Medications   Current Outpatient Rx  Name Route Sig Dispense Refill  . ALBUTEROL SULFATE HFA 108 (90 BASE) MCG/ACT IN AERS Inhalation Inhale 2 puffs into the lungs every 6 (six) hours as needed. Shortness of breath    . IBUPROFEN 200 MG PO TABS Oral Take 200-800 mg by mouth 2 (two) times daily as needed. For pain. Take 4 on first dose, then 2 on second dose as needed for  pain     . ONDANSETRON HCL 8 MG PO TABS Oral Take 1 tablet (8 mg total) by mouth every 4 (four) hours as needed for nausea. 10 tablet 0  . TAMSULOSIN HCL 0.4 MG PO CAPS Oral Take 1 capsule (0.4 mg total) by mouth at bedtime. 5 capsule 0  . TAMSULOSIN HCL 0.4 MG PO CAPS Oral Take 1 capsule (0.4 mg total) by mouth daily. 30 capsule 0  . TRAMADOL HCL 50 MG PO TABS Oral Take 0.5 tablets (25 mg total) by mouth every 6 (six) hours as needed  for pain. 15 tablet 0    BP 116/82  Pulse 90  Temp 98.1 F (36.7 C) (Oral)  Resp 24  Ht 6\' 1"  (1.854 m)  Wt 280 lb (127.007 kg)  BMI 36.94 kg/m2  SpO2 99%  LMP 01/26/2012  Physical Exam  Nursing note and vitals reviewed. Constitutional: She is oriented to person, place, and time. She appears well-developed and well-nourished.  HENT:  Head: Normocephalic and atraumatic.  Eyes: Conjunctivae and EOM are normal. Pupils are equal, round, and reactive to light.  Neck: Normal range of motion. Neck supple.  Cardiovascular: Normal rate and regular rhythm.   Pulmonary/Chest: Effort normal and breath sounds normal.  Abdominal: Soft. Bowel sounds are normal.  Genitourinary:       Minimal right flank tenderness  Musculoskeletal: Normal range of motion.  Neurological: She is alert and oriented to person, place, and time.  Skin: Skin is warm and dry.  Psychiatric: She has a normal mood and affect.    ED Course  Procedures (including critical care time)  Labs Reviewed  URINALYSIS, ROUTINE W REFLEX MICROSCOPIC - Abnormal; Notable for the following:    Glucose, UA 250 (*)  Hgb urine dipstick SMALL (*)     All other components within normal limits  PREGNANCY, URINE  URINE MICROSCOPIC-ADD ON   Dg Abd 1 View  02/18/2012  *RADIOLOGY REPORT*  Clinical Data: Right flank pain.  History kidney stones.  ABDOMEN - 1 VIEW  Comparison: 10/01/2011.  Findings: Radiopaque structure right aspect of the pelvis unchanged.  Question of phlebolith versus distal ureteral stone.  IMPRESSION: Radiopaque structure right aspect of the pelvis unchanged. Question of phlebolith versus distal ureteral stone.  Original Report Authenticated By: Fuller Canada, M.D.     1. Right flank pain       MDM  History and physical suggest kidney stone. We'll avoid CT abdomen pelvis secondary to radiation exposure. Plain film shows a possible distal ureteral stone. Patient got much better after IV hydration, pain  management. Will followup with urologist        Donnetta Hutching, MD 02/18/12 1610  Donnetta Hutching, MD 02/18/12 [id number]  Donnetta Hutching, MD 02/18/12 [phone]

## 2012-02-18 NOTE — ED Notes (Signed)
Pt reports right flank pain that started 2 hours ago. Pt w/ hx of kidney stones. Had a stone 2 months ago.

## 2012-02-21 ENCOUNTER — Encounter (HOSPITAL_COMMUNITY): Payer: Self-pay | Admitting: *Deleted

## 2012-02-21 ENCOUNTER — Emergency Department (HOSPITAL_COMMUNITY)
Admission: EM | Admit: 2012-02-21 | Discharge: 2012-02-21 | Disposition: A | Discharge status: Home / Self Care | Payer: Managed Care, Other (non HMO) | Attending: Emergency Medicine | Admitting: Emergency Medicine

## 2012-02-21 ENCOUNTER — Emergency Department (HOSPITAL_COMMUNITY): Payer: Managed Care, Other (non HMO)

## 2012-02-21 DIAGNOSIS — N200 Calculus of kidney: Secondary | ICD-10-CM

## 2012-02-21 LAB — BASIC METABOLIC PANEL
BUN: 15 mg/dL (ref 6–23)
Calcium: 10.1 mg/dL (ref 8.4–10.5)
Creatinine, Ser: 0.6 mg/dL (ref 0.50–1.10)
GFR calc Af Amer: >90 mL/min (ref >90)
GFR calc non Af Amer: >90 mL/min (ref >90)
Glucose, Bld: 86 mg/dL (ref 70–99)

## 2012-02-21 LAB — CBC WITH DIFFERENTIAL/PLATELET
Basophils Relative: 0 % (ref 0–1)
Eosinophils Absolute: 0.1 10*3/uL (ref 0.0–0.7)
Eosinophils Relative: 1 % (ref 0–5)
Hemoglobin: 13.1 g/dL (ref 12.0–15.0)
Lymphs Abs: 3.2 10*3/uL (ref 0.7–4.0)
MCH: 28.9 pg (ref 26.0–34.0)
MCHC: 34.5 g/dL (ref 30.0–36.0)
MCV: 83.9 fL (ref 78.0–100.0)
Monocytes Relative: 9 % (ref 3–12)
Platelets: 239 10*3/uL (ref 150–400)
RBC: 4.53 MIL/uL (ref 3.87–5.11)

## 2012-02-21 LAB — URINALYSIS, ROUTINE W REFLEX MICROSCOPIC
Bilirubin Urine: NEGATIVE
Protein, ur: NEGATIVE mg/dL
Urobilinogen, UA: 0.2 mg/dL (ref 0.0–1.0)

## 2012-02-21 LAB — URINE MICROSCOPIC-ADD ON

## 2012-02-21 MED ORDER — OXYCODONE-ACETAMINOPHEN 5-325 MG PO TABS: 2.0000 | ORAL_TABLET | ORAL | Status: AC | PRN

## 2012-02-21 MED ORDER — ONDANSETRON HCL 4 MG/2ML IJ SOLN
4.0000 mg | Freq: Once | INTRAMUSCULAR | Status: AC
  Administered 2012-02-21: 4 mg via INTRAVENOUS
  Filled 2012-02-21: qty 2

## 2012-02-21 MED ORDER — SODIUM CHLORIDE 0.9 % IV BOLUS (SEPSIS)
1000.0000 mL | Freq: Once | INTRAVENOUS | Status: AC
  Administered 2012-02-21: 1000 mL via INTRAVENOUS

## 2012-02-21 MED ORDER — HYDROMORPHONE HCL PF 1 MG/ML IJ SOLN
1.0000 mg | Freq: Once | INTRAMUSCULAR | Status: AC
  Administered 2012-02-21: 1 mg via INTRAVENOUS
  Filled 2012-02-21: qty 1

## 2012-02-21 MED ORDER — ONDANSETRON HCL 4 MG PO TABS: 4.0000 mg | ORAL_TABLET | Freq: Four times a day (QID) | ORAL | Status: AC

## 2012-02-21 MED ORDER — TAMSULOSIN HCL 0.4 MG PO CAPS: 0.4000 mg | ORAL_CAPSULE | Freq: Every day | ORAL | Status: DC

## 2012-02-21 MED ORDER — IBUPROFEN 800 MG PO TABS: 800.0000 mg | ORAL_TABLET | Freq: Three times a day (TID) | ORAL | Status: AC

## 2012-02-21 NOTE — ED Notes (Addendum)
Pt with dx of kidney stone on Sat and states pain and decrease in urine output, her doctor's office instructed pt to come here per pt

## 2012-02-21 NOTE — ED Notes (Signed)
Pt presents with flank pain. Pt states was seen here on Sunday and dx with kidney stones, has not passed stones with worsening pain over past 2 days. Pt unable to void at this time, as she voided 15 min prior to admission to ED.

## 2012-02-21 NOTE — ED Notes (Signed)
Patient would like something to drink. Patient was told she could possibly get some after her results come back.

## 2012-02-21 NOTE — ED Provider Notes (Signed)
History   This chart was scribed for Glynn Octave, MD by Charolett Bumpers . The patient was seen in room APA08/APA08. Patient's care was started at 18:42.    CSN: [id number]  Arrival date & time 02/21/12  1551   First MD Initiated Contact with Patient 02/21/12 1842      Chief Complaint  Patient presents with  . Nephrolithiasis    (Consider location/radiation/quality/duration/timing/severity/associated sxs/prior treatment) HPI Rachael Collier is a 26 y.o. female who presents to the Emergency Department complaining of constant, moderate right flank pain that radiates to RLQ abdomen for the past 4 days. Pt states that she was dx with kidney stones on 02/18/12 via x-ray. Pt reports associated dysuria, decrease in urine output and nausea. Pt states that she has not passed the stone. Pt denies any hematuria, fevers, or vomiting. Pt denies any h/o surgeries for kidney stones. Pt states that she took Hydrocodone for her symptoms with mild relief. Pt denies any decrease in appetite. Pt reports no h/o abdominal surgeries. Pt states that there is a possibility that she may be pregnant. LNMP was 01/26/12   Past Medical History  Diagnosis Date  . Kidney stones   . Herpes     anal    Past Surgical History  Procedure Date  . Inner ear surgery   . Arm hardware removal     History reviewed. No pertinent family history.  History  Substance Use Topics  . Smoking status: Never Smoker   . Smokeless tobacco: Not on file  . Alcohol Use: Yes    OB History    Grav Para Term Preterm Abortions TAB SAB Ect Mult Living                  Review of Systems A complete 10 system review of systems was obtained and all systems are negative except as noted in the HPI and PMH.   Allergies  Penicillins; Bee venom; and Hydrocodone-acetaminophen  Home Medications   Current Outpatient Rx  Name Route Sig Dispense Refill  . ALBUTEROL SULFATE HFA 108 (90 BASE) MCG/ACT IN AERS Inhalation Inhale  2 puffs into the lungs every 6 (six) hours as needed. Shortness of breath    . IBUPROFEN 200 MG PO TABS Oral Take 200-800 mg by mouth 2 (two) times daily as needed. For pain. Take 4 on first dose, then 2 on second dose as needed for  pain     . OXYCODONE-ACETAMINOPHEN 5-325 MG PO TABS Oral Take 1 tablet by mouth every 4 (four) hours as needed.      BP 106/50  Pulse 80  Temp 97.9 F (36.6 C) (Oral)  Resp 18  Ht 6\' 1"  (1.854 m)  Wt 305 lb (138.347 kg)  BMI 40.24 kg/m2  SpO2 98%  LMP 01/26/2012  Physical Exam  Nursing note and vitals reviewed. Constitutional: She is oriented to person, place, and time. She appears well-developed and well-nourished. No distress.  HENT:  Head: Normocephalic and atraumatic.  Eyes: EOM are normal. Pupils are equal, round, and reactive to light.  Neck: Normal range of motion. Neck supple. No tracheal deviation present.  Cardiovascular: Normal rate, regular rhythm and normal heart sounds.   Pulmonary/Chest: Effort normal and breath sounds normal. No respiratory distress. She has no wheezes.  Abdominal: Soft. Bowel sounds are normal. She exhibits no distension. There is no tenderness. There is CVA tenderness. There is no rebound, no guarding and no tenderness at McBurney's point.       Righ  CVA tenderness. No pain at McBurney's point.    Musculoskeletal: Normal range of motion. She exhibits no edema.  Neurological: She is alert and oriented to person, place, and time. No sensory deficit.  Skin: Skin is warm and dry.  Psychiatric: She has a normal mood and affect. Her behavior is normal.    ED Course  Procedures (including critical care time)  DIAGNOSTIC STUDIES: Oxygen Saturation is 99% on room air, normal by my interpretation.    COORDINATION OF CARE:  18:52-Discussed planned course of treatment with the patient including lab work, IV fluids and pain medication, who is agreeable at this time.   Results for orders placed during the hospital  encounter of 02/21/12  CBC WITH DIFFERENTIAL      Component Value Range   WBC 10.8 (*) 4.0 - 10.5 K/uL   RBC 4.53  3.87 - 5.11 MIL/uL   Hemoglobin 13.1  12.0 - 15.0 g/dL   HCT 16.1  09.6 - 04.5 %   MCV 83.9  78.0 - 100.0 fL   MCH 28.9  26.0 - 34.0 pg   MCHC 34.5  30.0 - 36.0 g/dL   RDW 40.9  81.1 - 91.4 %   Platelets 239  150 - 400 K/uL   Neutrophils Relative 60  43 - 77 %   Neutro Abs 6.4  1.7 - 7.7 K/uL   Lymphocytes Relative 30  12 - 46 %   Lymphs Abs 3.2  0.7 - 4.0 K/uL   Monocytes Relative 9  3 - 12 %   Monocytes Absolute 1.0  0.1 - 1.0 K/uL   Eosinophils Relative 1  0 - 5 %   Eosinophils Absolute 0.1  0.0 - 0.7 K/uL   Basophils Relative 0  0 - 1 %   Basophils Absolute 0.0  0.0 - 0.1 K/uL  BASIC METABOLIC PANEL      Component Value Range   Sodium 135  135 - 145 mEq/L   Potassium 3.8  3.5 - 5.1 mEq/L   Chloride 98  96 - 112 mEq/L   CO2 27  19 - 32 mEq/L   Glucose, Bld 86  70 - 99 mg/dL   BUN 15  6 - 23 mg/dL   Creatinine, Ser 7.82  0.50 - 1.10 mg/dL   Calcium 95.6  8.4 - 21.3 mg/dL   GFR calc non Af Amer >90  >90 mL/min   GFR calc Af Amer >90  >90 mL/min  URINALYSIS, ROUTINE W REFLEX MICROSCOPIC      Component Value Range   Color, Urine YELLOW  YELLOW   APPearance CLEAR  CLEAR   Specific Gravity, Urine 1.020  1.005 - 1.030   pH 7.0  5.0 - 8.0   Glucose, UA NEGATIVE  NEGATIVE mg/dL   Hgb urine dipstick TRACE (*) NEGATIVE   Bilirubin Urine NEGATIVE  NEGATIVE   Ketones, ur TRACE (*) NEGATIVE mg/dL   Protein, ur NEGATIVE  NEGATIVE mg/dL   Urobilinogen, UA 0.2  0.0 - 1.0 mg/dL   Nitrite NEGATIVE  NEGATIVE   Leukocytes, UA NEGATIVE  NEGATIVE  PREGNANCY, URINE      Component Value Range   Preg Test, Ur NEGATIVE  NEGATIVE  URINE MICROSCOPIC-ADD ON      Component Value Range   Squamous Epithelial / LPF FEW (*) RARE   WBC, UA 7-10  <3 WBC/hpf   RBC / HPF 0-2  <3 RBC/hpf   Bacteria, UA RARE  RARE     Ct Abdomen Pelvis Wo  Contrast  02/21/2012  *RADIOLOGY REPORT*   Clinical Data: Right lower quadrant pain and right flank pain. Prior history of renal stones.  CT ABDOMEN AND PELVIS WITHOUT CONTRAST  Technique:  Multidetector CT imaging of the abdomen and pelvis was performed following the standard protocol without intravenous contrast.  Comparison: 08/09/2011, radiograph 02/18/2012  Findings: Right middle lobe scarring stable.  Lung bases are otherwise clear.  The unenhanced liver demonstrates hypodensity compatible with hepatic steatosis.  Minimal scarring about the gallbladder fossa again noted.  Gallbladder, adrenal glands, spleen, and pancreas are normal.  1 mm nonobstructing left lower renal pole calculus image 46 is stable.  Moderate right hydroureteronephrosis is noted with a 7 mm calculus at the right ureterovesicular junction.  The bladder is decompressed but otherwise unremarkable.  No radiopaque left ureteral or right renal calculus is identified.  Minimal right perinephric stranding is present.  No perinephric fluid collection.  No lymphadenopathy.  Trace free pelvic fluid may be physiologic in this premenopausal female.  Uterus and ovaries are normal.  The appendix is normal.  No colonic or small bowel wall thickening.  Minimal posterior subcutaneous edema.  This is stable.  No acute osseous finding.  L3 limbus type vertebra incidentally noted.  This is stable.  IMPRESSION: Apparent interval distal passage of previously seen right ureteral calculus, now at the level of the right ureterovesicular junction. This produces moderate proximal right hydroureteronephrosis.  No new acute abnormality otherwise.  Original Report Authenticated By: Harrel Lemon, M.D.     No diagnosis found.    MDM  Flank pain for the past several days. Seen 4 days ago and diagnosed with kidney stone. Increased pain with hematuria and dysuria.  Hematuria without infection. Pregnancy test negative  Interval passage of right sided 7 mm stone. Normal Renal function. Normal urine  output. No vomiting in the ED. Referral to urology. Return precautions discussed.  I personally performed the services described in this documentation, which was scribed in my presence.  The recorded information has been reviewed and considered.        Glynn Octave, MD 02/21/12 2144

## 2012-05-01 ENCOUNTER — Emergency Department (HOSPITAL_COMMUNITY): Payer: Managed Care, Other (non HMO)

## 2012-05-01 ENCOUNTER — Encounter (HOSPITAL_COMMUNITY): Payer: Self-pay | Admitting: *Deleted

## 2012-05-01 ENCOUNTER — Emergency Department (HOSPITAL_COMMUNITY)
Admission: EM | Admit: 2012-05-01 | Discharge: 2012-05-01 | Disposition: A | Discharge status: Home / Self Care | Payer: Managed Care, Other (non HMO) | Attending: Emergency Medicine | Admitting: Emergency Medicine

## 2012-05-01 DIAGNOSIS — K7689 Other specified diseases of liver: Secondary | ICD-10-CM | POA: Insufficient documentation

## 2012-05-01 DIAGNOSIS — R109 Unspecified abdominal pain: Secondary | ICD-10-CM

## 2012-05-01 DIAGNOSIS — N200 Calculus of kidney: Secondary | ICD-10-CM | POA: Insufficient documentation

## 2012-05-01 LAB — COMPREHENSIVE METABOLIC PANEL
CO2: 25 mEq/L (ref 19–32)
Calcium: 9.9 mg/dL (ref 8.4–10.5)
Creatinine, Ser: 0.63 mg/dL (ref 0.50–1.10)
GFR calc Af Amer: >90 mL/min (ref >90)
GFR calc non Af Amer: >90 mL/min (ref >90)
Glucose, Bld: 72 mg/dL (ref 70–99)

## 2012-05-01 LAB — CBC WITH DIFFERENTIAL/PLATELET
Basophils Absolute: 0 10*3/uL (ref 0.0–0.1)
Eosinophils Relative: 2 % (ref 0–5)
HCT: 39.4 % (ref 36.0–46.0)
Lymphocytes Relative: 43 % (ref 12–46)
Lymphs Abs: 4.5 10*3/uL — ABNORMAL HIGH (ref 0.7–4.0)
MCV: 84.9 fL (ref 78.0–100.0)
Monocytes Absolute: 0.7 10*3/uL (ref 0.1–1.0)
RDW: 13.1 % (ref 11.5–15.5)
WBC: 10.7 10*3/uL — ABNORMAL HIGH (ref 4.0–10.5)

## 2012-05-01 LAB — URINALYSIS, ROUTINE W REFLEX MICROSCOPIC
Ketones, ur: NEGATIVE mg/dL
Leukocytes, UA: NEGATIVE
Nitrite: NEGATIVE
Specific Gravity, Urine: >1.03 — ABNORMAL HIGH (ref 1.005–1.030)
pH: 6 (ref 5.0–8.0)

## 2012-05-01 LAB — URINE MICROSCOPIC-ADD ON

## 2012-05-01 LAB — PREGNANCY, URINE: Preg Test, Ur: NEGATIVE

## 2012-05-01 MED ORDER — FAMOTIDINE IN NACL 20-0.9 MG/50ML-% IV SOLN
20.0000 mg | Freq: Once | INTRAVENOUS | Status: AC
  Administered 2012-05-01: 20 mg via INTRAVENOUS
  Filled 2012-05-01: qty 50

## 2012-05-01 MED ORDER — IOHEXOL 300 MG/ML  SOLN
100.0000 mL | Freq: Once | INTRAMUSCULAR | Status: AC | PRN
  Administered 2012-05-01: 100 mL via INTRAVENOUS

## 2012-05-01 MED ORDER — SODIUM CHLORIDE 0.9 % IV SOLN
INTRAVENOUS | Status: DC
  Administered 2012-05-01: 21:00:00 via INTRAVENOUS

## 2012-05-01 MED ORDER — GI COCKTAIL ~~LOC~~
30.0000 mL | Freq: Once | ORAL | Status: AC
  Administered 2012-05-01: 30 mL via ORAL
  Filled 2012-05-01: qty 30

## 2012-05-01 MED ORDER — ONDANSETRON HCL 4 MG PO TABS: 4.0000 mg | ORAL_TABLET | Freq: Three times a day (TID) | ORAL | Status: DC | PRN

## 2012-05-01 NOTE — ED Provider Notes (Signed)
History     CSN: [id number]  Arrival date & time 05/01/12  1745   First MD Initiated Contact with Patient 05/01/12 1859      Chief Complaint  Patient presents with  . Abdominal Pain     HPI Pt was seen at 1940.  Per pt, c/o gradual onset and persistence of constant upper abd "pain" for the past 1 month, worse today.  Describes her abd pain now as generalized and associated with nausea.  States nothing improves or worsens the pain. Denies back pain, no vomiting/diarrhea, no CP/SOB, no cough.      Past Medical History  Diagnosis Date  . Kidney stones   . Herpes     anal    Past Surgical History  Procedure Date  . Inner ear surgery   . Arm hardware removal      History  Substance Use Topics  . Smoking status: Never Smoker   . Smokeless tobacco: Not on file  . Alcohol Use: Yes     Review of Systems ROS: Statement: All systems negative except as marked or noted in the HPI; Constitutional: Negative for fever and chills. ; ; Eyes: Negative for eye pain, redness and discharge. ; ; ENMT: Negative for ear pain, hoarseness, nasal congestion, sinus pressure and sore throat. ; ; Cardiovascular: Negative for chest pain, palpitations, diaphoresis, dyspnea and peripheral edema. ; ; Respiratory: Negative for cough, wheezing and stridor. ; ; Gastrointestinal: +abd pain, nausea. Negative for vomiting, diarrhea, blood in stool, hematemesis, jaundice and rectal bleeding. . ; ; Genitourinary: Negative for dysuria, flank pain and hematuria. ; ; Musculoskeletal: Negative for back pain and neck pain. Negative for swelling and trauma.; ; Skin: Negative for pruritus, rash, abrasions, blisters, bruising and skin lesion.; ; Neuro: Negative for headache, lightheadedness and neck stiffness. Negative for weakness, altered level of consciousness , altered mental status, extremity weakness, paresthesias, involuntary movement, seizure and syncope.       Allergies  Penicillins; Bee venom; and  Hydrocodone-acetaminophen  Home Medications   Current Outpatient Rx  Name Route Sig Dispense Refill  . ALBUTEROL SULFATE HFA 108 (90 BASE) MCG/ACT IN AERS Inhalation Inhale 2 puffs into the lungs every 6 (six) hours as needed. Shortness of breath      BP 138/85  Pulse 79  Temp 97.9 F (36.6 C) (Oral)  Resp 20  Ht 6\' 1"  (1.854 m)  Wt 310 lb (140.615 kg)  BMI 40.90 kg/m2  SpO2 100%  LMP 05/01/2012  Physical Exam 1945: Physical examination:  Nursing notes reviewed; Vital signs and O2 SAT reviewed;  Constitutional: Well developed, Well nourished, Well hydrated, In no acute distress; Head:  Normocephalic, atraumatic; Eyes: EOMI, PERRL, No scleral icterus; ENMT: Mouth and pharynx normal, Mucous membranes moist; Neck: Supple, Full range of motion, No lymphadenopathy; Cardiovascular: Regular rate and rhythm, No murmur, rub, or gallop; Respiratory: Breath sounds clear & equal bilaterally, No rales, rhonchi, wheezes.  Speaking full sentences with ease, Normal respiratory effort/excursion; Chest: Nontender, Movement normal; Abdomen: Soft, +RUQ, mid-epigastric, LUQ tenderness to palp. No rebound or guarding. Nondistended, Normal bowel sounds; Genitourinary: No CVA tenderness; Extremities: Pulses normal, No tenderness, No edema, No calf edema or asymmetry.; Neuro: AA&Ox3, Major CN grossly intact.  Speech clear. No gross focal motor or sensory deficits in extremities.; Skin: Color normal, Warm, Dry.   ED Course  Procedures    MDM  MDM Reviewed: nursing note, vitals and previous chart Reviewed previous: CT scan Interpretation: CT scan and labs   Results  for orders placed during the hospital encounter of 05/01/12  URINALYSIS, ROUTINE W REFLEX MICROSCOPIC      Component Value Range   Color, Urine YELLOW  YELLOW   APPearance CLEAR  CLEAR   Specific Gravity, Urine >1.030 (*) 1.005 - 1.030   pH 6.0  5.0 - 8.0   Glucose, UA NEGATIVE  NEGATIVE mg/dL   Hgb urine dipstick MODERATE (*) NEGATIVE    Bilirubin Urine NEGATIVE  NEGATIVE   Ketones, ur NEGATIVE  NEGATIVE mg/dL   Protein, ur NEGATIVE  NEGATIVE mg/dL   Urobilinogen, UA 0.2  0.0 - 1.0 mg/dL   Nitrite NEGATIVE  NEGATIVE   Leukocytes, UA NEGATIVE  NEGATIVE  PREGNANCY, URINE      Component Value Range   Preg Test, Ur NEGATIVE  NEGATIVE  URINE MICROSCOPIC-ADD ON      Component Value Range   Squamous Epithelial / LPF MANY (*) RARE   WBC, UA 0-2  <3 WBC/hpf   RBC / HPF 3-6  <3 RBC/hpf   Bacteria, UA FEW (*) RARE  LIPASE, BLOOD      Component Value Range   Lipase 39  11 - 59 U/L  CBC WITH DIFFERENTIAL      Component Value Range   WBC 10.7 (*) 4.0 - 10.5 K/uL   RBC 4.64  3.87 - 5.11 MIL/uL   Hemoglobin 13.4  12.0 - 15.0 g/dL   HCT 96.0  45.4 - 09.8 %   MCV 84.9  78.0 - 100.0 fL   MCH 28.9  26.0 - 34.0 pg   MCHC 34.0  30.0 - 36.0 g/dL   RDW 11.9  14.7 - 82.9 %   Platelets 273  150 - 400 K/uL   Neutrophils Relative 48  43 - 77 %   Neutro Abs 5.2  1.7 - 7.7 K/uL   Lymphocytes Relative 43  12 - 46 %   Lymphs Abs 4.5 (*) 0.7 - 4.0 K/uL   Monocytes Relative 7  3 - 12 %   Monocytes Absolute 0.7  0.1 - 1.0 K/uL   Eosinophils Relative 2  0 - 5 %   Eosinophils Absolute 0.2  0.0 - 0.7 K/uL   Basophils Relative 0  0 - 1 %   Basophils Absolute 0.0  0.0 - 0.1 K/uL  COMPREHENSIVE METABOLIC PANEL      Component Value Range   Sodium 139  135 - 145 mEq/L   Potassium 3.7  3.5 - 5.1 mEq/L   Chloride 103  96 - 112 mEq/L   CO2 25  19 - 32 mEq/L   Glucose, Bld 72  70 - 99 mg/dL   BUN 10  6 - 23 mg/dL   Creatinine, Ser 5.62  0.50 - 1.10 mg/dL   Calcium 9.9  8.4 - 13.0 mg/dL   Total Protein 7.7  6.0 - 8.3 g/dL   Albumin 3.9  3.5 - 5.2 g/dL   AST 51 (*) 0 - 37 U/L   ALT 75 (*) 0 - 35 U/L   Alkaline Phosphatase 73  39 - 117 U/L   Total Bilirubin 0.3  0.3 - 1.2 mg/dL   GFR calc non Af Amer >90  >90 mL/min   GFR calc Af Amer >90  >90 mL/min   Ct Abdomen Pelvis W Contrast 05/01/2012  *RADIOLOGY REPORT*  Clinical Data: 26 year old  female abdominal pain.  CT ABDOMEN AND PELVIS WITH CONTRAST  Technique:  Multidetector CT imaging of the abdomen and pelvis was performed following the standard  protocol during bolus administration of intravenous contrast.  Contrast: OMNIPAQUE IOHEXOL 300 MG/ML  SOLN  Comparison: 02/21/2012 and earlier.  Findings: Minor lung base atelectasis. Stable visualized osseous structures.  Chronic superior L3 endplate irregularity.  No pelvic free fluid.  There is some mild degree of pelvic lipomatosis.  Redundant, otherwise negative distal colon.  Uterus and adnexa are within normal limits.  Incidental tampon in place. Unremarkable bladder.  The more proximal colon is within normal limits.  The appendix is within normal limits.  No dilated small bowel.  Oral contrast has not yet reached the distal small bowel.  The stomach is decompressed.  Diffuse decreased liver density compatible with steatosis. Negative gallbladder, spleen, pancreas, adrenal glands, portal venous system, major arterial structures, right kidney and right ureter.  Nonobstructing 1-2 mm calculus at the left mid pole. Otherwise normal left kidney.  Normal left ureter.  No abdominal free fluid.  IMPRESSION: 1.  No acute or inflammatory changes in the abdomen or pelvis. Appendix within normal limits. 2.  Hepatic steatosis. 3.  Nonobstructing left nephrolithiasis.   Original Report Authenticated By: Harley Hallmark, M.D.    Results for HAIDY, KACKLEY (MRN [id number]) as of 05/01/2012 22:08  Ref. Range 08/26/2008 22:18 03/04/2010 10:28 05/01/2012 20:02  AST Latest Range: 0-37 U/L 20 50 (H) 51 (H)  ALT Latest Range: 0-35 U/L 22 66 (H) 75 (H)      2205:  Improved after meds and wants to go home now.  LFT's elevated per hx of same.  UA contaminated. Dx testing d/w pt.  Questions answered.  Verb understanding, agreeable to d/c home with outpt f/u.        Laray Anger, DO 05/03/12 1304

## 2012-05-01 NOTE — ED Notes (Signed)
C/o upper abd pain x 1 month; reports diffuse abd pain today; c/o nausea, denies vomiting/diarrhea

## 2012-05-01 NOTE — ED Notes (Signed)
Patient finished with CT PO contrast.

## 2012-07-18 ENCOUNTER — Encounter (HOSPITAL_COMMUNITY): Payer: Self-pay | Admitting: *Deleted

## 2012-07-18 ENCOUNTER — Emergency Department (HOSPITAL_COMMUNITY)
Admission: EM | Admit: 2012-07-18 | Discharge: 2012-07-19 | Disposition: A | Discharge status: Home / Self Care | Payer: Managed Care, Other (non HMO) | Attending: Emergency Medicine | Admitting: Emergency Medicine

## 2012-07-18 ENCOUNTER — Emergency Department (HOSPITAL_COMMUNITY): Payer: Managed Care, Other (non HMO)

## 2012-07-18 DIAGNOSIS — N200 Calculus of kidney: Secondary | ICD-10-CM | POA: Insufficient documentation

## 2012-07-18 DIAGNOSIS — Z79899 Other long term (current) drug therapy: Secondary | ICD-10-CM | POA: Insufficient documentation

## 2012-07-18 DIAGNOSIS — J9801 Acute bronchospasm: Secondary | ICD-10-CM | POA: Insufficient documentation

## 2012-07-18 DIAGNOSIS — E282 Polycystic ovarian syndrome: Secondary | ICD-10-CM | POA: Insufficient documentation

## 2012-07-18 DIAGNOSIS — R0789 Other chest pain: Secondary | ICD-10-CM

## 2012-07-18 DIAGNOSIS — M940 Chondrocostal junction syndrome [Tietze]: Secondary | ICD-10-CM | POA: Insufficient documentation

## 2012-07-18 DIAGNOSIS — Z8619 Personal history of other infectious and parasitic diseases: Secondary | ICD-10-CM | POA: Insufficient documentation

## 2012-07-18 DIAGNOSIS — R071 Chest pain on breathing: Secondary | ICD-10-CM | POA: Insufficient documentation

## 2012-07-18 HISTORY — DX: Polycystic ovarian syndrome: E28.2

## 2012-07-18 MED ORDER — LORAZEPAM 2 MG/ML IJ SOLN: 0.5000 mg | Freq: Once | INTRAMUSCULAR | Status: DC

## 2012-07-18 MED ORDER — IPRATROPIUM BROMIDE 0.02 % IN SOLN
0.5000 mg | Freq: Once | RESPIRATORY_TRACT | Status: AC
  Administered 2012-07-18: 0.5 mg via RESPIRATORY_TRACT
  Filled 2012-07-18: qty 2.5

## 2012-07-18 MED ORDER — KETOROLAC TROMETHAMINE 60 MG/2ML IM SOLN
60.0000 mg | Freq: Once | INTRAMUSCULAR | Status: AC
  Administered 2012-07-19: 60 mg via INTRAMUSCULAR
  Filled 2012-07-18: qty 2

## 2012-07-18 MED ORDER — SODIUM CHLORIDE 0.9 % IV SOLN: INTRAVENOUS | Status: DC

## 2012-07-18 MED ORDER — IPRATROPIUM BROMIDE 0.02 % IN SOLN
0.5000 mg | Freq: Once | RESPIRATORY_TRACT | Status: AC
  Administered 2012-07-19: 0.5 mg via RESPIRATORY_TRACT
  Filled 2012-07-18: qty 2.5

## 2012-07-18 MED ORDER — LORAZEPAM 2 MG/ML IJ SOLN
0.5000 mg | Freq: Once | INTRAMUSCULAR | Status: AC
  Administered 2012-07-19: 0.5 mg via INTRAMUSCULAR
  Filled 2012-07-18: qty 1

## 2012-07-18 MED ORDER — KETOROLAC TROMETHAMINE 30 MG/ML IJ SOLN: 30.0000 mg | Freq: Once | INTRAMUSCULAR | Status: DC

## 2012-07-18 MED ORDER — ALBUTEROL SULFATE (5 MG/ML) 0.5% IN NEBU
5.0000 mg | INHALATION_SOLUTION | Freq: Once | RESPIRATORY_TRACT | Status: AC
  Administered 2012-07-19: 5 mg via RESPIRATORY_TRACT
  Filled 2012-07-18: qty 1

## 2012-07-18 MED ORDER — ALBUTEROL SULFATE (5 MG/ML) 0.5% IN NEBU
5.0000 mg | INHALATION_SOLUTION | Freq: Once | RESPIRATORY_TRACT | Status: AC
  Administered 2012-07-18: 5 mg via RESPIRATORY_TRACT
  Filled 2012-07-18: qty 1

## 2012-07-18 NOTE — ED Provider Notes (Signed)
History  This chart was scribed for Ward Givens, MD by Manuela Schwartz, ED scribe. This patient was seen in room APA14/APA14 and the patient's care was started at 2159.   CSN: [id number]  Arrival date & time 07/18/12  2159   First MD Initiated Contact with Patient 07/18/12 2210      Chief Complaint  Patient presents with  . Shortness of Breath   The history is provided by the patient. No language interpreter was used.   Rachael Collier is a 26 y.o. female who presents to the Emergency Department complaining of shortness of breath and left sided CP w/inspiration which suddenly began 1 hour ago. She states sx began while driving home from work this PM and denies specific cause. She states hx of asthma but this does not seem similar and does not feel wheezy. She states her leg muscles feels twitchy and also had flu like sx last week with low grade fever which have since resolved Friday 6 days ago. She denies recent high stress levels. Patient indicates she is having pleuritic chest pain on the left side. She denies any cough.  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  Her PCP is Dr. Hughie Closs  Past Medical History  Diagnosis Date  . Kidney stones   . Herpes     anal  . Polycystic ovarian disease diagnosed 1 month ago     Past Surgical History  Procedure Date  . Inner ear surgery   . Arm hardware removal     No family history on file.  History  Substance Use Topics  . Smoking status: Never Smoker   . Smokeless tobacco: Not on file  . Alcohol Use: Yes  employed as CNA  OB History    Grav Para Term Preterm Abortions TAB SAB Ect Mult Living                  Review of Systems  Constitutional: Negative for fever and chills.  HENT: Negative for congestion.   Respiratory: Positive for shortness of breath. Negative for cough.   Cardiovascular: Positive for chest pain.  Gastrointestinal:  Negative for nausea, vomiting and abdominal pain.  Musculoskeletal: Negative for back pain.  Neurological: Negative for weakness.  All other systems reviewed and are negative.    Allergies  Penicillins; Bee venom; and Hydrocodone-acetaminophen  Home Medications   Current Outpatient Rx  Name  Route  Sig  Dispense  Refill  . METFORMIN HCL 500 MG PO TABS   Oral   Take 500 mg by mouth daily.         Marland Kitchen PRENATAL MULTIVITAMIN CH   Oral   Take 1 tablet by mouth daily.         . ALBUTEROL SULFATE HFA 108 (90 BASE) MCG/ACT IN AERS   Inhalation   Inhale 2 puffs into the lungs every 6 (six) hours as needed. Shortness of breath           Triage Vitals: BP 114/71  Pulse 93  Temp 98.3 F (36.8 C) (Oral)  Resp 24  Ht 6\' 1"  (1.854 m)  Wt 320 lb (145.151 kg)  BMI 42.22 kg/m2  SpO2 97%  LMP 07/15/2012  Vital signs normal    Physical Exam  Nursing note and vitals reviewed. Constitutional: She is oriented to person, place, and time. She appears well-developed and well-nourished.  Non-toxic appearance. She does not appear ill.  No distress.       seemed anxious and clutching her chest.  HENT:  Head: Normocephalic and atraumatic.  Right Ear: External ear normal.  Left Ear: External ear normal.  Nose: Nose normal. No mucosal edema or rhinorrhea.  Mouth/Throat: Oropharynx is clear and moist and mucous membranes are normal. No dental abscesses or uvula swelling.  Eyes: Conjunctivae normal and EOM are normal. Pupils are equal, round, and reactive to light.  Neck: Normal range of motion and full passive range of motion without pain. Neck supple.  Cardiovascular: Normal rate, regular rhythm and normal heart sounds.  Exam reveals no gallop and no friction rub.   No murmur heard. Pulmonary/Chest: Effort normal and breath sounds normal. No respiratory distress. She has no wheezes. She has no rhonchi. She has no rales. She exhibits no tenderness and no crepitus.         Late end  expiratory squeeking on the right. Bilateral costochondral conjunction tenderness that reproduces her complaints of pain  Abdominal: Soft. Normal appearance and bowel sounds are normal. She exhibits no distension. There is no tenderness. There is no rebound and no guarding.  Musculoskeletal: Normal range of motion. She exhibits no edema and no tenderness.       Calves are non tender, thighs are non tender  Neurological: She is alert and oriented to person, place, and time. She has normal strength. No cranial nerve deficit.  Skin: Skin is warm, dry and intact. No rash noted. No erythema. No pallor.  Psychiatric: Her speech is normal and behavior is normal. Her mood appears not anxious.       anxious    ED Course  Procedures (including critical care time)  Medications  albuterol (PROVENTIL) (5 MG/ML) 0.5% nebulizer solution 5 mg (not administered)  ipratropium (ATROVENT) nebulizer solution 0.5 mg (not administered)  albuterol (PROVENTIL) (5 MG/ML) 0.5% nebulizer solution 5 mg (5 mg Nebulization Given 07/18/12 2301)  ipratropium (ATROVENT) nebulizer solution 0.5 mg (0.5 mg Nebulization Given 07/18/12 2301)  ketorolac (TORADOL) injection 60 mg (60 mg Intramuscular Given 07/19/12 0006)  LORazepam (ATIVAN) injection 0.5 mg (0.5 mg Intramuscular Given 07/19/12 0004)   DIAGNOSTIC STUDIES: Oxygen Saturation is 97% on room air, normal by my interpretation.    COORDINATION OF CARE: At 22:40 Discussed treatment plan with patient which includes proventil, atrovent, IV fluids, ativan, toradol, CXR. Patient agrees.   Recheck 23:40, nurse unable to get IV, will change meds to IM. Pt is feeling better after her nebulizer treatment. On reexam she has improved air movement and the squeeking in her right lung is now gone.she still has mild shortness of breath so we'll repeat the nebulizer.  Recheck 00:25 breathing better, better air movement, pain better.   Dg Chest Portable 1 View  07/18/2012   *RADIOLOGY REPORT*  Clinical Data: Shortness of breath.  PORTABLE CHEST - 1 VIEW  Comparison: 07/21/2010  Findings: Shallow inspiration. The heart size and pulmonary vascularity are normal. The lungs appear clear and expanded without focal air space disease or consolidation. No blunting of the costophrenic angles.  No pneumothorax.  Mediastinal contours appear intact.  No significant change since previous study.  IMPRESSION: Shallow inspiration.  No evidence of active pulmonary disease.   Original Report Authenticated By: Burman Nieves, M.D.      1. Chest wall pain   2. Costochondritis   3. Bronchospasm     Patient's Medications  New Prescriptions   ALBUTEROL (PROVENTIL HFA;VENTOLIN HFA) 108 (90 BASE) MCG/ACT INHALER  Inhale 2 puffs into the lungs every 4 (four) hours as needed for wheezing.   METHOCARBAMOL (ROBAXIN) 500 MG TABLET    Take 1 or 2 po Q 6hrs for chest wall pain    Devoria Albe, MD, FACEP   MDM  I personally performed the services described in this documentation, which was scribed in my presence. The recorded information has been reviewed and considered.  Devoria Albe, MD, Armando Gang          Ward Givens, MD 07/19/12 [phone]

## 2012-07-18 NOTE — ED Notes (Signed)
Short of breath for the past hour, states it hurts to take a deep  breath

## 2012-07-19 MED ORDER — IPRATROPIUM BROMIDE 0.02 % IN SOLN: 0.5000 mg | Freq: Once | RESPIRATORY_TRACT | Status: DC

## 2012-07-19 MED ORDER — METHOCARBAMOL 500 MG PO TABS: ORAL_TABLET | ORAL | Status: DC

## 2012-07-19 MED ORDER — ALBUTEROL SULFATE HFA 108 (90 BASE) MCG/ACT IN AERS: 2.0000 | INHALATION_SPRAY | RESPIRATORY_TRACT | Status: DC | PRN

## 2012-07-19 MED ORDER — ALBUTEROL SULFATE (5 MG/ML) 0.5% IN NEBU: 5.0000 mg | INHALATION_SOLUTION | Freq: Once | RESPIRATORY_TRACT | Status: DC

## 2012-10-03 ENCOUNTER — Encounter: Payer: Self-pay | Admitting: *Deleted

## 2012-10-03 ENCOUNTER — Encounter: Payer: 59 | Attending: Adult Health | Admitting: *Deleted

## 2012-10-03 VITALS — Ht 73.0 in | Wt 314.8 lb

## 2012-10-03 DIAGNOSIS — E282 Polycystic ovarian syndrome: Secondary | ICD-10-CM | POA: Insufficient documentation

## 2012-10-03 DIAGNOSIS — E119 Type 2 diabetes mellitus without complications: Secondary | ICD-10-CM | POA: Insufficient documentation

## 2012-10-03 DIAGNOSIS — Z713 Dietary counseling and surveillance: Secondary | ICD-10-CM | POA: Insufficient documentation

## 2012-10-03 NOTE — Progress Notes (Signed)
  Medical Nutrition Therapy:  Appt start time: 1500 end time:  1630.  Assessment:  Primary concerns today: patient here with her husband and young stepson for diabetes, PCOS and obesity. Lives with husband and stepson. Both shop and prepare the meals. 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 Hours vary also. Just got meter yesterday which she states she knows how to use, so will start SMBG now. Current A1c on 09/26/12 was 6.8%  MEDICATIONS: see list   DIETARY INTAKE:  Usual eating pattern includes 2-3 meals and 1 snacks per day.  Everyday foods include most food groups of easily prepared foods.  Avoided foods include sodas.    24-hr recall:  B ( AM): left overs or anything in the refrigerator, skips 4 days a week.  Snk ( AM): none  L ( PM): skips or is trying to eat lunch, has been eating fast food trying to choose lower calorie choices, previously sweet tea or water or Green tea Snk ( PM): none D ( PM): meat, starchy vegetables, starches. Juice, Koolaid or water Snk ( PM): flavored oatmeal x 2 Beverages: water, juice, green tea  Usual physical activity: used to walk every day, once started working, stopped   Estimated energy needs: 1400 calories 158 g carbohydrates 105 g protein 39 g fat  Progress Towards Goal(s):  In progress.   Nutritional Diagnosis:  NI-1.5 Excessive energy intake As related to activity level.  As evidenced by BMI of 41.6.    Intervention:  Nutrition counseling for weight loss initiated. Discussed Carb Counting as method of portion control and BG management. Taught her how to read food labels, and we discussed the many benefits of increased activity. Explained the relationship of PCOS to insulin resistance and BG management and the importance of weight management to control both.  Plan:  Aim for 3 Carb Choices per meal (45 grams) +/- 1 either way  Aim for 0-1 Carbs per snack if hungry  Consider reading food labels for Total Carbohydrate and  Fat Grams of foods Consider  increasing your activity level by walking  for 15-30 minutes daily as tolerated Consider checking BG at alternate times per day as directed by MD  Consider taking medication Metformin as directed by MD  Handouts given during visit include: Living Well with Diabetes Carb Counting and Food Label handouts Meal Plan Card  Monitoring/Evaluation:  Dietary intake, exercise, reading food labels, SMBG, and body weight in 4 week(s).

## 2012-10-08 NOTE — Patient Instructions (Signed)
Plan:  Aim for 3 Carb Choices per meal (45 grams) +/- 1 either way  Aim for 0-1 Carbs per snack if hungry  Consider reading food labels for Total Carbohydrate and Fat Grams of foods Consider  increasing your activity level by walking  for 15-30 minutes daily as tolerated Consider checking BG at alternate times per day as directed by MD  Consider taking medication Metformin as directed by MD

## 2012-11-05 ENCOUNTER — Ambulatory Visit: Payer: 59 | Admitting: *Deleted

## 2012-11-11 ENCOUNTER — Emergency Department (HOSPITAL_COMMUNITY)
Admission: EM | Admit: 2012-11-11 | Discharge: 2012-11-11 | Disposition: A | Discharge status: Home / Self Care | Payer: Managed Care, Other (non HMO) | Attending: Emergency Medicine | Admitting: Emergency Medicine

## 2012-11-11 ENCOUNTER — Emergency Department (HOSPITAL_COMMUNITY): Payer: Managed Care, Other (non HMO)

## 2012-11-11 ENCOUNTER — Encounter (HOSPITAL_COMMUNITY): Payer: Self-pay

## 2012-11-11 DIAGNOSIS — B009 Herpesviral infection, unspecified: Secondary | ICD-10-CM | POA: Insufficient documentation

## 2012-11-11 DIAGNOSIS — S40019A Contusion of unspecified shoulder, initial encounter: Secondary | ICD-10-CM | POA: Insufficient documentation

## 2012-11-11 DIAGNOSIS — S40012A Contusion of left shoulder, initial encounter: Secondary | ICD-10-CM

## 2012-11-11 DIAGNOSIS — Z87442 Personal history of urinary calculi: Secondary | ICD-10-CM | POA: Insufficient documentation

## 2012-11-11 DIAGNOSIS — S161XXA Strain of muscle, fascia and tendon at neck level, initial encounter: Secondary | ICD-10-CM

## 2012-11-11 DIAGNOSIS — E119 Type 2 diabetes mellitus without complications: Secondary | ICD-10-CM | POA: Insufficient documentation

## 2012-11-11 DIAGNOSIS — J45909 Unspecified asthma, uncomplicated: Secondary | ICD-10-CM | POA: Insufficient documentation

## 2012-11-11 DIAGNOSIS — Y9389 Activity, other specified: Secondary | ICD-10-CM | POA: Insufficient documentation

## 2012-11-11 DIAGNOSIS — Y92410 Unspecified street and highway as the place of occurrence of the external cause: Secondary | ICD-10-CM | POA: Insufficient documentation

## 2012-11-11 DIAGNOSIS — S0990XA Unspecified injury of head, initial encounter: Secondary | ICD-10-CM

## 2012-11-11 DIAGNOSIS — Z880 Allergy status to penicillin: Secondary | ICD-10-CM | POA: Insufficient documentation

## 2012-11-11 DIAGNOSIS — S139XXA Sprain of joints and ligaments of unspecified parts of neck, initial encounter: Secondary | ICD-10-CM | POA: Insufficient documentation

## 2012-11-11 DIAGNOSIS — E282 Polycystic ovarian syndrome: Secondary | ICD-10-CM | POA: Insufficient documentation

## 2012-11-11 DIAGNOSIS — Z91038 Other insect allergy status: Secondary | ICD-10-CM | POA: Insufficient documentation

## 2012-11-11 MED ORDER — OXYCODONE-ACETAMINOPHEN 5-325 MG PO TABS
2.0000 | ORAL_TABLET | Freq: Once | ORAL | Status: AC
  Administered 2012-11-11: 2 via ORAL
  Filled 2012-11-11: qty 2

## 2012-11-11 MED ORDER — OXYCODONE-ACETAMINOPHEN 5-325 MG PO TABS: 2.0000 | ORAL_TABLET | ORAL | Status: DC | PRN
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
## 2012-11-11 NOTE — Progress Notes (Signed)
Assessment charted on the wrong patient.  D/c'd the assessment.

## 2012-11-21 ENCOUNTER — Encounter: Payer: Self-pay | Admitting: *Deleted

## 2012-11-21 DIAGNOSIS — J45909 Unspecified asthma, uncomplicated: Secondary | ICD-10-CM

## 2012-11-22 ENCOUNTER — Other Ambulatory Visit (HOSPITAL_COMMUNITY): Payer: Self-pay | Admitting: *Deleted

## 2012-11-22 ENCOUNTER — Ambulatory Visit (HOSPITAL_COMMUNITY)
Admission: RE | Admit: 2012-11-22 | Discharge: 2012-11-22 | Disposition: A | Discharge status: Home / Self Care | Payer: Managed Care, Other (non HMO) | Source: Ambulatory Visit | Attending: *Deleted | Admitting: *Deleted

## 2012-11-22 DIAGNOSIS — M25551 Pain in right hip: Secondary | ICD-10-CM

## 2012-11-22 DIAGNOSIS — M545 Low back pain, unspecified: Secondary | ICD-10-CM | POA: Insufficient documentation

## 2012-11-22 DIAGNOSIS — M25559 Pain in unspecified hip: Secondary | ICD-10-CM | POA: Insufficient documentation

## 2012-11-25 ENCOUNTER — Encounter: Payer: Self-pay | Admitting: Adult Health

## 2012-11-25 ENCOUNTER — Encounter: Payer: Self-pay | Admitting: *Deleted

## 2012-11-25 ENCOUNTER — Ambulatory Visit (INDEPENDENT_AMBULATORY_CARE_PROVIDER_SITE_OTHER): Payer: Private Health Insurance - Indemnity | Admitting: Adult Health

## 2012-11-25 VITALS — BP 124/76 | Ht 73.0 in | Wt 305.0 lb

## 2012-11-25 DIAGNOSIS — E119 Type 2 diabetes mellitus without complications: Secondary | ICD-10-CM | POA: Insufficient documentation

## 2012-11-25 DIAGNOSIS — F329 Major depressive disorder, single episode, unspecified: Secondary | ICD-10-CM

## 2012-11-25 DIAGNOSIS — F32A Depression, unspecified: Secondary | ICD-10-CM

## 2012-11-25 HISTORY — DX: Depression, unspecified: F32.A

## 2012-11-25 MED ORDER — ESCITALOPRAM OXALATE 10 MG PO TABS: 10.0000 mg | ORAL_TABLET | Freq: Every day | ORAL | Status: DC

## 2012-11-25 NOTE — Progress Notes (Signed)
Subjective:     Patient ID: Rachael Collier, female   DOB: [birth year], 27 y.o.   MRN: [id number]  HPI  Rachael Collier is a 27 year old white female recently diagnosed with diabetes, she takes metformin 500 mg 1 bid. Her fasting sugars are 100 to 110 and she has lost about 8 pounds.She is having some depression with her moods being up and down. She is not suicidal.She saw NP at Dr. Scharlene Gloss office for pain right hip/leg.   Review of Systems Complaint as above. Reviewed past medical, surgical, social,and family history. Reviewed medication and allergies.    Objective:   Physical Exam Blood pressure 124/76, height 6\' 1"  (1.854 m), weight 305 lb (138.347 kg). Talk only, she needs labs in about 4 weeks, and she is praised over weight loss. Will try Lexapro to see if helps with depression.    Assessment:      Diabetes Depression    Plan:    Start Lexapro  Return in 4 weeks to check fasting CMP, lipids and A1c   Continue with weight loss and check blood sugars Follow up with me 1 week after labs

## 2012-11-25 NOTE — Patient Instructions (Addendum)
Try lexapro Cont. To check blood sugars Sign up for my chart Get fasting labs in 4 weeks and see me the next week

## 2012-12-23 ENCOUNTER — Other Ambulatory Visit: Payer: Private Health Insurance - Indemnity

## 2012-12-23 DIAGNOSIS — Z0189 Encounter for other specified special examinations: Secondary | ICD-10-CM

## 2012-12-23 DIAGNOSIS — R7309 Other abnormal glucose: Secondary | ICD-10-CM

## 2012-12-24 LAB — COMPREHENSIVE METABOLIC PANEL
Albumin: 4.6 g/dL (ref 3.5–5.2)
CO2: 26 mEq/L (ref 19–32)
Glucose, Bld: 83 mg/dL (ref 70–99)
Potassium: 4.3 mEq/L (ref 3.5–5.3)
Sodium: 138 mEq/L (ref 135–145)
Total Protein: 7.7 g/dL (ref 6.0–8.3)

## 2012-12-24 LAB — LIPID PANEL
Cholesterol: 218 mg/dL — ABNORMAL HIGH (ref 0–200)
LDL Cholesterol: 120 mg/dL — ABNORMAL HIGH (ref 0–99)
Triglycerides: 226 mg/dL — ABNORMAL HIGH (ref <150)

## 2012-12-24 LAB — HEMOGLOBIN A1C: Mean Plasma Glucose: 137 mg/dL — ABNORMAL HIGH (ref <117)

## 2012-12-31 ENCOUNTER — Ambulatory Visit: Payer: Private Health Insurance - Indemnity | Admitting: Adult Health

## 2012-12-31 ENCOUNTER — Ambulatory Visit (INDEPENDENT_AMBULATORY_CARE_PROVIDER_SITE_OTHER): Payer: Private Health Insurance - Indemnity | Admitting: Adult Health

## 2012-12-31 ENCOUNTER — Encounter: Payer: Self-pay | Admitting: Adult Health

## 2012-12-31 VITALS — BP 100/80 | Ht 73.0 in | Wt 313.0 lb

## 2012-12-31 DIAGNOSIS — E119 Type 2 diabetes mellitus without complications: Secondary | ICD-10-CM

## 2012-12-31 DIAGNOSIS — F329 Major depressive disorder, single episode, unspecified: Secondary | ICD-10-CM

## 2012-12-31 DIAGNOSIS — F3289 Other specified depressive episodes: Secondary | ICD-10-CM

## 2012-12-31 DIAGNOSIS — E785 Hyperlipidemia, unspecified: Secondary | ICD-10-CM

## 2012-12-31 HISTORY — DX: Hyperlipidemia, unspecified: E78.5

## 2012-12-31 NOTE — Patient Instructions (Addendum)
Watch diet and exercise Pap and labs in 3 months

## 2012-12-31 NOTE — Progress Notes (Signed)
Subjective:     Patient ID: Rachael Collier, female   DOB: 1986-01-07, 27 y.o.   MRN: [id number]  HPI Lael is back to review labs and follow up lexapro.She says she is better with the lexapro, she is sleeping better.   Review of Systems No complaints Reviewed past medical,surgical, social and family history. Reviewed medications and allergies.     Objective:   Physical Exam Blood pressure 100/80, height 6\' 1"  (1.854 m), weight 313 lb (141.976 kg), last menstrual period 11/19/2012.   Reviewed labs, her A1c was 6.4 which is down from 6.8. But her TC was 218, trig 226,LDL 120 and HDL53 ratio 4.1. Assessment:      Diabetes Depression Dyslipidemia    Plan:      Watch diet Increase exercise Take krell oil  Return in 3 months for pap and labs Depo due 6/2

## 2013-01-01 ENCOUNTER — Ambulatory Visit: Payer: Private Health Insurance - Indemnity | Admitting: Adult Health

## 2013-01-06 ENCOUNTER — Encounter: Payer: Self-pay | Admitting: Obstetrics & Gynecology

## 2013-01-06 ENCOUNTER — Ambulatory Visit (INDEPENDENT_AMBULATORY_CARE_PROVIDER_SITE_OTHER): Payer: Private Health Insurance - Indemnity | Admitting: Obstetrics & Gynecology

## 2013-01-06 VITALS — BP 110/70 | Ht 73.0 in | Wt 314.0 lb

## 2013-01-06 DIAGNOSIS — Z3200 Encounter for pregnancy test, result unknown: Secondary | ICD-10-CM

## 2013-01-06 DIAGNOSIS — Z309 Encounter for contraceptive management, unspecified: Secondary | ICD-10-CM

## 2013-01-06 DIAGNOSIS — Z3202 Encounter for pregnancy test, result negative: Secondary | ICD-10-CM

## 2013-01-06 DIAGNOSIS — Z3049 Encounter for surveillance of other contraceptives: Secondary | ICD-10-CM

## 2013-01-06 LAB — POCT URINE PREGNANCY: Preg Test, Ur: NEGATIVE

## 2013-01-06 MED ORDER — MEDROXYPROGESTERONE ACETATE 150 MG/ML IM SUSP
150.0000 mg | Freq: Once | INTRAMUSCULAR | Status: AC
  Administered 2013-01-06: 150 mg via INTRAMUSCULAR

## 2013-04-02 ENCOUNTER — Other Ambulatory Visit: Payer: Private Health Insurance - Indemnity

## 2013-04-02 ENCOUNTER — Other Ambulatory Visit: Payer: Private Health Insurance - Indemnity | Admitting: Adult Health

## 2013-04-02 ENCOUNTER — Ambulatory Visit: Payer: Private Health Insurance - Indemnity

## 2013-05-04 ENCOUNTER — Emergency Department (HOSPITAL_COMMUNITY)
Admission: EM | Admit: 2013-05-04 | Discharge: 2013-05-04 | Disposition: A | Discharge status: Home / Self Care | Payer: Managed Care, Other (non HMO) | Attending: Emergency Medicine | Admitting: Emergency Medicine

## 2013-05-04 ENCOUNTER — Encounter (HOSPITAL_COMMUNITY): Payer: Self-pay | Admitting: Emergency Medicine

## 2013-05-04 ENCOUNTER — Other Ambulatory Visit (HOSPITAL_COMMUNITY): Payer: Self-pay | Admitting: Emergency Medicine

## 2013-05-04 ENCOUNTER — Ambulatory Visit (HOSPITAL_COMMUNITY)
Admit: 2013-05-04 | Discharge: 2013-05-04 | Disposition: A | Discharge status: Home / Self Care | Payer: Managed Care, Other (non HMO) | Source: Ambulatory Visit | Attending: Emergency Medicine | Admitting: Emergency Medicine

## 2013-05-04 DIAGNOSIS — Z880 Allergy status to penicillin: Secondary | ICD-10-CM | POA: Insufficient documentation

## 2013-05-04 DIAGNOSIS — F329 Major depressive disorder, single episode, unspecified: Secondary | ICD-10-CM | POA: Insufficient documentation

## 2013-05-04 DIAGNOSIS — R102 Pelvic and perineal pain: Secondary | ICD-10-CM

## 2013-05-04 DIAGNOSIS — R300 Dysuria: Secondary | ICD-10-CM | POA: Insufficient documentation

## 2013-05-04 DIAGNOSIS — Z3202 Encounter for pregnancy test, result negative: Secondary | ICD-10-CM | POA: Insufficient documentation

## 2013-05-04 DIAGNOSIS — F3289 Other specified depressive episodes: Secondary | ICD-10-CM | POA: Insufficient documentation

## 2013-05-04 DIAGNOSIS — Z87442 Personal history of urinary calculi: Secondary | ICD-10-CM | POA: Insufficient documentation

## 2013-05-04 DIAGNOSIS — Z8619 Personal history of other infectious and parasitic diseases: Secondary | ICD-10-CM | POA: Insufficient documentation

## 2013-05-04 DIAGNOSIS — Z79899 Other long term (current) drug therapy: Secondary | ICD-10-CM | POA: Insufficient documentation

## 2013-05-04 DIAGNOSIS — N949 Unspecified condition associated with female genital organs and menstrual cycle: Secondary | ICD-10-CM | POA: Insufficient documentation

## 2013-05-04 DIAGNOSIS — J45909 Unspecified asthma, uncomplicated: Secondary | ICD-10-CM | POA: Insufficient documentation

## 2013-05-04 DIAGNOSIS — E119 Type 2 diabetes mellitus without complications: Secondary | ICD-10-CM | POA: Insufficient documentation

## 2013-05-04 LAB — URINALYSIS, ROUTINE W REFLEX MICROSCOPIC
Bilirubin Urine: NEGATIVE
Nitrite: NEGATIVE
Protein, ur: NEGATIVE mg/dL
Specific Gravity, Urine: >1.03 — ABNORMAL HIGH (ref 1.005–1.030)
Urobilinogen, UA: 0.2 mg/dL (ref 0.0–1.0)

## 2013-05-04 LAB — PREGNANCY, URINE: Preg Test, Ur: NEGATIVE

## 2013-05-04 MED ORDER — NAPROXEN 500 MG PO TABS: 500.0000 mg | ORAL_TABLET | Freq: Two times a day (BID) | ORAL | Status: DC

## 2013-05-04 MED ORDER — OXYCODONE-ACETAMINOPHEN 5-325 MG PO TABS: 2.0000 | ORAL_TABLET | ORAL | Status: DC | PRN

## 2013-05-04 MED ORDER — KETOROLAC TROMETHAMINE 60 MG/2ML IM SOLN
60.0000 mg | Freq: Once | INTRAMUSCULAR | Status: AC
  Administered 2013-05-04: 60 mg via INTRAMUSCULAR
  Filled 2013-05-04: qty 2

## 2013-05-04 NOTE — ED Provider Notes (Signed)
Rachael Collier is a 27 y.o. female who presents to the ED for follow up ultrasound. US Transvaginal Non-ob  05/04/2013   CLINICAL DATA:  Pelvic pain. Clinical suspicion for ovarian torsion.  EXAM: TRANSABDOMINAL AND TRANSVAGINAL ULTRASOUND OF PELVIS  DOPPLER ULTRASOUND OF OVARIES  TECHNIQUE: Both transabdominal and transvaginal ultrasound examinations of the pelvis were performed. Transabdominal technique was performed for global imaging of the pelvis including uterus, ovaries, adnexal regions, and pelvic cul-de-sac.  It was necessary to proceed with endovaginal exam following the transabdominal exam to visualize the endometrium and ovaries. Color and duplex Doppler ultrasound was utilized to evaluate blood flow to the ovaries.  COMPARISON:  None.  FINDINGS: Uterus  Measurements: 7.4 x 3.0 x 3.8 cm. No fibroids or other mass visualized.  Endometrium  Thickness: 3 mm  No focal abnormality visualized.  Right ovary  Measurements: Not directly visualized by transabdominal or transvaginal sonography, however no adnexal mass identified.  Left ovary  Measurements: 3.6 x 2.2 x 2.1 cm. Normal appearance/no adnexal mass.  Pulsed Doppler evaluation of the left ovary demonstrates normal low-resistance arterial and venous waveforms. The right ovary could not be evaluated by Doppler as it was not visualized sonographically.  IMPRESSION: Normal appearance of uterus and left ovary. No sonographic evidence of left ovarian torsion.  Nonvisualization of right ovary, however no right adnexal mass identified.   Electronically Signed   By: Myles Rosenthal   On: 05/04/2013 11:03   US Pelvis Complete  05/04/2013   CLINICAL DATA:  Pelvic pain. Clinical suspicion for ovarian torsion.  EXAM: TRANSABDOMINAL AND TRANSVAGINAL ULTRASOUND OF PELVIS  DOPPLER ULTRASOUND OF OVARIES  TECHNIQUE: Both transabdominal and transvaginal ultrasound examinations of the pelvis were performed. Transabdominal technique was performed for global imaging of the  pelvis including uterus, ovaries, adnexal regions, and pelvic cul-de-sac.  It was necessary to proceed with endovaginal exam following the transabdominal exam to visualize the endometrium and ovaries. Color and duplex Doppler ultrasound was utilized to evaluate blood flow to the ovaries.  COMPARISON:  None.  FINDINGS: Uterus  Measurements: 7.4 x 3.0 x 3.8 cm. No fibroids or other mass visualized.  Endometrium  Thickness: 3 mm  No focal abnormality visualized.  Right ovary  Measurements: Not directly visualized by transabdominal or transvaginal sonography, however no adnexal mass identified.  Left ovary  Measurements: 3.6 x 2.2 x 2.1 cm. Normal appearance/no adnexal mass.  Pulsed Doppler evaluation of the left ovary demonstrates normal low-resistance arterial and venous waveforms. The right ovary could not be evaluated by Doppler as it was not visualized sonographically.  IMPRESSION: Normal appearance of uterus and left ovary. No sonographic evidence of left ovarian torsion.  Nonvisualization of right ovary, however no right adnexal mass identified.   Electronically Signed   By: Myles Rosenthal   On: 05/04/2013 11:03   [place] Art/ven Flow Abd Pelv Doppler  05/04/2013   CLINICAL DATA:  Pelvic pain. Clinical suspicion for ovarian torsion.  EXAM: TRANSABDOMINAL AND TRANSVAGINAL ULTRASOUND OF PELVIS  DOPPLER ULTRASOUND OF OVARIES  TECHNIQUE: Both transabdominal and transvaginal ultrasound examinations of the pelvis were performed. Transabdominal technique was performed for global imaging of the pelvis including uterus, ovaries, adnexal regions, and pelvic cul-de-sac.  It was necessary to proceed with endovaginal exam following the transabdominal exam to visualize the endometrium and ovaries. Color and duplex Doppler ultrasound was utilized to evaluate blood flow to the ovaries.  COMPARISON:  None.  FINDINGS: Uterus  Measurements: 7.4 x 3.0 x 3.8 cm. No fibroids or other mass  visualized.  Endometrium  Thickness: 3 mm  No focal  abnormality visualized.  Right ovary  Measurements: Not directly visualized by transabdominal or transvaginal sonography, however no adnexal mass identified.  Left ovary  Measurements: 3.6 x 2.2 x 2.1 cm. Normal appearance/no adnexal mass.  Pulsed Doppler evaluation of the left ovary demonstrates normal low-resistance arterial and venous waveforms. The right ovary could not be evaluated by Doppler as it was not visualized sonographically.  IMPRESSION: Normal appearance of uterus and left ovary. No sonographic evidence of left ovarian torsion.  Nonvisualization of right ovary, however no right adnexal mass identified.   Electronically Signed   By: Myles Rosenthal   On: 05/04/2013 11:03   Discussed results of ultrasound with the patient. Referral given and she will follow up with Dr. Emelda Fear at [facility]. She will return here as needed.  [place], [place] 05/04/13 1530

## 2013-05-04 NOTE — ED Provider Notes (Signed)
Medical screening examination/treatment/procedure(s) were performed by non-physician practitioner and as supervising physician I was immediately available for consultation/collaboration. Devoria Albe, MD, FACEP   Ward Givens, MD 05/04/13 [phone]

## 2013-05-04 NOTE — ED Provider Notes (Signed)
CSN: [id number]     Arrival date & time 05/04/13  3244 History   First MD Initiated Contact with Patient 05/04/13 [phone]     Chief Complaint  Patient presents with  . Pelvic Pain   (Consider location/radiation/quality/duration/timing/severity/associated sxs/prior Treatment) HPI Comments: Pt presents with acute onset of pain tht is sharp and stabbing and occurred just prior to arrival 45 minutes ago - she was having intercourse when she felt acute onset of pain when "I came down wrong".  She has no bleeding, no d/c, no fever, no vomiting and no urinary sx until this happened - now has some pain with urination.    Patient is a 27 y.o. female presenting with pelvic pain. The history is provided by the patient and the spouse.  Pelvic Pain    Past Medical History  Diagnosis Date  . Kidney stones   . Herpes     anal  . Polycystic ovarian disease   . Diabetes mellitus without complication   . Asthma   . Headache(784.0)   . Hx of chlamydia infection   . History of PCOS   . Depression 11/25/2012  . Dyslipidemia 12/31/2012   Past Surgical History  Procedure Laterality Date  . Inner ear surgery    . Arm hardware removal     Family History  Problem Relation Age of Onset  . Hypertension Mother   . Diabetes Mother   . Depression Mother   . Mental illness Mother   . Diabetes Father   . Heart attack Maternal Grandmother   . Heart disease Maternal Grandmother   . Diabetes Maternal Grandmother   . Diabetes Maternal Grandfather   . Diabetes Paternal Grandmother   . Diabetes Paternal Grandfather    History  Substance Use Topics  . Smoking status: Never Smoker   . Smokeless tobacco: Never Used  . Alcohol Use: Yes     Comment: couple of times a year, mixed drink   OB History   Grav Para Term Preterm Abortions TAB SAB Ect Mult Living                 Review of Systems  Genitourinary: Positive for pelvic pain.  All other systems reviewed and are negative.    Allergies   Penicillins; Bee venom; and Hydrocodone-acetaminophen  Home Medications   Current Outpatient Rx  Name  Route  Sig  Dispense  Refill  . albuterol (PROVENTIL HFA;VENTOLIN HFA) 108 (90 BASE) MCG/ACT inhaler   Inhalation   Inhale 2 puffs into the lungs every 4 (four) hours as needed for wheezing.   1 Inhaler   0   . diclofenac (VOLTAREN) 75 MG EC tablet               . ONE TOUCH ULTRA TEST test strip               . ONETOUCH DELICA LANCETS FINE MISC               . escitalopram (LEXAPRO) 10 MG tablet   Oral   Take 1 tablet (10 mg total) by mouth daily.   30 tablet   6   . medroxyPROGESTERone (DEPO-PROVERA) 150 MG/ML injection   Intramuscular   Inject 150 mg into the muscle every 3 (three) months.         . metFORMIN (GLUCOPHAGE) 500 MG tablet   Oral   Take 500 mg by mouth 2 (two) times daily with a meal.          .  naproxen (NAPROSYN) 500 MG tablet   Oral   Take 1 tablet (500 mg total) by mouth 2 (two) times daily with a meal.   30 tablet   0   . oxyCODONE-acetaminophen (PERCOCET/ROXICET) 5-325 MG per tablet   Oral   Take 2 tablets by mouth every 4 (four) hours as needed for pain.   6 tablet   0    BP 110/74  Pulse 103  Temp(Src) 98.2 F (36.8 C) (Oral)  Resp 20  Ht 6\' 1"  (1.854 m)  Wt 330 lb (149.687 kg)  BMI 43.55 kg/m2  SpO2 97% Physical Exam  Nursing note and vitals reviewed. Constitutional: She appears well-developed and well-nourished. No distress.  HENT:  Head: Normocephalic and atraumatic.  Mouth/Throat: Oropharynx is clear and moist. No oropharyngeal exudate.  Eyes: Conjunctivae and EOM are normal. Pupils are equal, round, and reactive to light. Right eye exhibits no discharge. Left eye exhibits no discharge. No scleral icterus.  Neck: Normal range of motion. Neck supple. No JVD present. No thyromegaly present.  Cardiovascular: Normal rate, regular rhythm, normal heart sounds and intact distal pulses.  Exam reveals no gallop and no  friction rub.   No murmur heard. Pulmonary/Chest: Effort normal and breath sounds normal. No respiratory distress. She has no wheezes. She has no rales.  Abdominal: Soft. Bowel sounds are normal. She exhibits no distension and no mass. There is tenderness ( SP ttp, mild guarding, no other ttp, soft abd, obese.).  Genitourinary:  Internal exam - chaperone present - normal appearing ext genitalia - no d/c, no bleeding, no FB, no lacerations.  Mild L adnexal ttp  Musculoskeletal: Normal range of motion. She exhibits no edema and no tenderness.  Lymphadenopathy:    She has no cervical adenopathy.  Neurological: She is alert. Coordination normal.  Skin: Skin is warm and dry. No rash noted. No erythema.  Psychiatric: She has a normal mood and affect. Her behavior is normal.    ED Course  Procedures (including critical care time) Labs Review Labs Reviewed  URINALYSIS, ROUTINE W REFLEX MICROSCOPIC - Abnormal; Notable for the following:    Specific Gravity, Urine >1.030 (*)    Glucose, UA 250 (*)    All other components within normal limits  PREGNANCY, URINE   Imaging Review No results found.  MDM   1. Pelvic pain    The patient has ongoing pain in her pelvis, again she states that this came on acutely during sexual intercourse. It has been persistent and seemed to be worse with palpation of the suprapubic area. She does not have a urinary infection, she is not pregnant, she does have mild glucosuria and a high specific gravity which would be consistent with a relative dehydration but there is no hematuria to suggest a kidney stone, pregnancy test is negative which takes an ectopic pregnancy off of the table. Would also consider that this could be a possible ovarian cyst which has ruptured, I performed a bedside ultrasound but due to body habitus the study is very limited. I have scheduled an ultrasound for her at 10:00 this morning, she has requested to go home and will come back for the  study.  Doubt appendicitis, no pain in the right lower quadrant, pain came on acutely.   Meds given in ED:  Medications  ketorolac (TORADOL) injection 60 mg (not administered)    New Prescriptions   NAPROXEN (NAPROSYN) 500 MG TABLET    Take 1 tablet (500 mg total) by mouth 2 (two)  times daily with a meal.   OXYCODONE-ACETAMINOPHEN (PERCOCET/ROXICET) 5-325 MG PER TABLET    Take 2 tablets by mouth every 4 (four) hours as needed for pain.        Vida Roller, MD 05/04/13 [phone]

## 2013-05-04 NOTE — ED Notes (Signed)
Patient c/o suprapubic pain x 45 minutes s/p intercourse.  Denies any aggression or foreign objects.  Patient denies vaginal bleeding.

## 2013-05-04 NOTE — ED Notes (Signed)
Ambulated to bathroom to collect urine specimen.  After voiding c/o throbbing discomfort

## 2013-05-04 NOTE — ED Notes (Signed)
Ultrasound in am -  Arrive at 9:45am for scheduled exam at 10:00.   Have a full bladder - 20 minutes before arrival drink 16 fluid ounces of liquid

## 2013-05-20 ENCOUNTER — Other Ambulatory Visit: Payer: Private Health Insurance - Indemnity | Admitting: Adult Health

## 2013-06-05 ENCOUNTER — Other Ambulatory Visit (HOSPITAL_COMMUNITY): Payer: Self-pay | Admitting: Internal Medicine

## 2013-06-05 ENCOUNTER — Ambulatory Visit (HOSPITAL_COMMUNITY)
Admission: RE | Admit: 2013-06-05 | Discharge: 2013-06-05 | Disposition: A | Discharge status: Home / Self Care | Payer: Managed Care, Other (non HMO) | Source: Ambulatory Visit | Attending: Internal Medicine | Admitting: Internal Medicine

## 2013-06-05 DIAGNOSIS — K7689 Other specified diseases of liver: Secondary | ICD-10-CM | POA: Insufficient documentation

## 2013-06-05 DIAGNOSIS — K5792 Diverticulitis of intestine, part unspecified, without perforation or abscess without bleeding: Secondary | ICD-10-CM

## 2013-06-05 DIAGNOSIS — N200 Calculus of kidney: Secondary | ICD-10-CM | POA: Insufficient documentation

## 2013-06-05 DIAGNOSIS — R109 Unspecified abdominal pain: Secondary | ICD-10-CM | POA: Insufficient documentation

## 2013-06-05 DIAGNOSIS — R197 Diarrhea, unspecified: Secondary | ICD-10-CM

## 2013-06-06 ENCOUNTER — Other Ambulatory Visit (HOSPITAL_COMMUNITY): Payer: Managed Care, Other (non HMO)

## 2013-07-14 ENCOUNTER — Other Ambulatory Visit: Payer: Private Health Insurance - Indemnity | Admitting: Adult Health

## 2013-07-16 ENCOUNTER — Emergency Department (HOSPITAL_COMMUNITY)
Admission: EM | Admit: 2013-07-16 | Discharge: 2013-07-16 | Disposition: A | Discharge status: Home / Self Care | Payer: BC Managed Care – PPO | Attending: Emergency Medicine | Admitting: Emergency Medicine

## 2013-07-16 ENCOUNTER — Encounter (HOSPITAL_COMMUNITY): Payer: Self-pay | Admitting: Emergency Medicine

## 2013-07-16 DIAGNOSIS — Z79899 Other long term (current) drug therapy: Secondary | ICD-10-CM | POA: Insufficient documentation

## 2013-07-16 DIAGNOSIS — Z8659 Personal history of other mental and behavioral disorders: Secondary | ICD-10-CM | POA: Insufficient documentation

## 2013-07-16 DIAGNOSIS — E119 Type 2 diabetes mellitus without complications: Secondary | ICD-10-CM | POA: Insufficient documentation

## 2013-07-16 DIAGNOSIS — Z880 Allergy status to penicillin: Secondary | ICD-10-CM | POA: Insufficient documentation

## 2013-07-16 DIAGNOSIS — G609 Hereditary and idiopathic neuropathy, unspecified: Secondary | ICD-10-CM | POA: Insufficient documentation

## 2013-07-16 DIAGNOSIS — J45909 Unspecified asthma, uncomplicated: Secondary | ICD-10-CM | POA: Insufficient documentation

## 2013-07-16 DIAGNOSIS — Z862 Personal history of diseases of the blood and blood-forming organs and certain disorders involving the immune mechanism: Secondary | ICD-10-CM | POA: Insufficient documentation

## 2013-07-16 DIAGNOSIS — Z3202 Encounter for pregnancy test, result negative: Secondary | ICD-10-CM | POA: Insufficient documentation

## 2013-07-16 DIAGNOSIS — IMO0002 Reserved for concepts with insufficient information to code with codable children: Secondary | ICD-10-CM

## 2013-07-16 DIAGNOSIS — Z8639 Personal history of other endocrine, nutritional and metabolic disease: Secondary | ICD-10-CM | POA: Insufficient documentation

## 2013-07-16 DIAGNOSIS — R209 Unspecified disturbances of skin sensation: Secondary | ICD-10-CM | POA: Insufficient documentation

## 2013-07-16 DIAGNOSIS — H538 Other visual disturbances: Secondary | ICD-10-CM | POA: Insufficient documentation

## 2013-07-16 DIAGNOSIS — R110 Nausea: Secondary | ICD-10-CM | POA: Insufficient documentation

## 2013-07-16 DIAGNOSIS — Z8619 Personal history of other infectious and parasitic diseases: Secondary | ICD-10-CM | POA: Insufficient documentation

## 2013-07-16 DIAGNOSIS — Z87442 Personal history of urinary calculi: Secondary | ICD-10-CM | POA: Insufficient documentation

## 2013-07-16 LAB — BASIC METABOLIC PANEL
BUN: 11 mg/dL (ref 6–23)
CO2: 26 mEq/L (ref 19–32)
Calcium: 9.6 mg/dL (ref 8.4–10.5)
Chloride: 100 mEq/L (ref 96–112)
Creatinine, Ser: 0.7 mg/dL (ref 0.50–1.10)
GFR calc Af Amer: >90 mL/min (ref >90)
GFR calc non Af Amer: >90 mL/min (ref >90)
Glucose, Bld: 101 mg/dL — ABNORMAL HIGH (ref 70–99)
Potassium: 3.6 mEq/L (ref 3.5–5.1)
Sodium: 139 mEq/L (ref 135–145)

## 2013-07-16 LAB — GLUCOSE, CAPILLARY: Glucose-Capillary: 122 mg/dL — ABNORMAL HIGH (ref 70–99)

## 2013-07-16 LAB — POCT PREGNANCY, URINE: Preg Test, Ur: NEGATIVE

## 2013-07-16 MED ORDER — ONDANSETRON 4 MG PO TBDP
4.0000 mg | ORAL_TABLET | Freq: Once | ORAL | Status: AC
  Administered 2013-07-16: 4 mg via ORAL
  Filled 2013-07-16: qty 1

## 2013-07-16 MED ORDER — ONDANSETRON 4 MG PO TBDP: 4.0000 mg | ORAL_TABLET | Freq: Once | ORAL | Status: DC

## 2013-07-16 NOTE — ED Provider Notes (Signed)
CSN: [id number]     Arrival date & time 07/16/13  1539 History   First MD Initiated Contact with Patient 07/16/13 1605     Chief Complaint  Patient presents with  . Peripheral Neuropathy  . Nausea  . Blurred Vision   (Consider location/radiation/quality/duration/timing/severity/associated sxs/prior Treatment) Patient is a 27 y.o. female presenting with general illness. The history is provided by the patient.  Illness Location:  Hands/Feet Quality:  Numbness described as pins/needles Severity:  Mild Onset quality:  Sudden Duration:  2 days Timing:  Sporadic Progression:  Worsening Chronicity:  New Context:  Spontaneous numbness and tingling of hands and feet with occasional sharp pains in them Relieved by:  Nothing Worsened by:  Using hands/feet Ineffective treatments:  None tried Associated symptoms: no abdominal pain, no cough, no fever, no shortness of breath and no vomiting   Risk factors:  Diabetes, however recently taken off PO meds due to low A1C   Past Medical History  Diagnosis Date  . Kidney stones   . Herpes     anal  . Polycystic ovarian disease   . Diabetes mellitus without complication   . Asthma   . Headache(784.0)   . Hx of chlamydia infection   . History of PCOS   . Depression 11/25/2012  . Dyslipidemia 12/31/2012   Past Surgical History  Procedure Laterality Date  . Inner ear surgery    . Arm hardware removal     Family History  Problem Relation Age of Onset  . Hypertension Mother   . Diabetes Mother   . Depression Mother   . Mental illness Mother   . Diabetes Father   . Heart attack Maternal Grandmother   . Heart disease Maternal Grandmother   . Diabetes Maternal Grandmother   . Diabetes Maternal Grandfather   . Diabetes Paternal Grandmother   . Diabetes Paternal Grandfather    History  Substance Use Topics  . Smoking status: Never Smoker   . Smokeless tobacco: Never Used  . Alcohol Use: Yes     Comment: couple of times a year, mixed  drink   OB History   Grav Para Term Preterm Abortions TAB SAB Ect Mult Living                 Review of Systems  Constitutional: Negative for fever and unexpected weight change.  Respiratory: Negative for cough and shortness of breath.   Gastrointestinal: Negative for vomiting and abdominal pain.  All other systems reviewed and are negative.    Allergies  Penicillins; Bee venom; and Hydrocodone-acetaminophen  Home Medications   Current Outpatient Rx  Name  Route  Sig  Dispense  Refill  . albuterol (PROVENTIL HFA;VENTOLIN HFA) 108 (90 BASE) MCG/ACT inhaler   Inhalation   Inhale 2 puffs into the lungs every 4 (four) hours as needed for wheezing.   1 Inhaler   0    BP 138/82  Pulse 87  Temp(Src) 98.4 F (36.9 C) (Oral)  Resp 18  Ht 6\' 1"  (1.854 m)  Wt 330 lb (149.687 kg)  BMI 43.55 kg/m2  SpO2 100%  LMP 07/15/2013 Physical Exam  Nursing note and vitals reviewed. Constitutional: She is oriented to person, place, and time. She appears well-developed and well-nourished. No distress.  HENT:  Head: Normocephalic and atraumatic.  Eyes: EOM are normal. Pupils are equal, round, and reactive to light.  Neck: Normal range of motion. Neck supple.  Cardiovascular: Normal rate and regular rhythm.  Exam reveals no friction rub.  No murmur heard. Pulmonary/Chest: Effort normal and breath sounds normal. No respiratory distress. She has no wheezes. She has no rales.  Abdominal: Soft. She exhibits no distension. There is no tenderness. There is no rebound.  Musculoskeletal: Normal range of motion. She exhibits no edema.  Pain in bilateral hands and feet upon being touched. Feels like pins and needles. Normal capillary refill and ROM.  Neurological: She is alert and oriented to person, place, and time.  Skin: She is not diaphoretic.    ED Course  Procedures (including critical care time) Labs Review Labs Reviewed  GLUCOSE, CAPILLARY - Abnormal; Notable for the following:     Glucose-Capillary 122 (*)    All other components within normal limits  BASIC METABOLIC PANEL   Imaging Review No results found.  EKG Interpretation   None       MDM   1. Nausea   2. Occasional numbness/prickling/tingling of fingers and toes    35F presents with complaints of pain in her hands and her feet for the past one to 2 days. She grabs the pain is tingling throughout her entire hand issues. She also is complaining of pain in her feet when she is using her feet. Pains are sporadic, not constant. She is a type I diabetic, but was recently taken off medication by her PCP since her hemoglobin A1c was less than 6. Her sugar here is 122. Her normal sugars are between 90 and 120. She's also had some mild nausea today. She does report some stressors of recent Christmas coming up. She is otherwise healthy. She denies any fevers, urinary issues, muscle cramping, belly pain, shortness of breath. She also states some occasional blurry vision. It lasts 1-3 seconds then disappears with closing her eyes. Here vitals are stable. She has mild pain in her hands bilaterally when touched, but has normal sensation. She has normal capillary refill and normal strength and full range of motion. Pain is similar for her feet. Her deficits and are consistent with stroke. Belly benign. Her sugars have not been elevated chronically to suggest peripheral neuropathy. Will check labs. Labs normal. Not pregnant. Given zofran for nausea. Instructed to f/u with PCP for possible peripheral neuropathy.   Dagmar Hait, MD 07/16/13 [phone]

## 2013-07-16 NOTE — ED Notes (Signed)
CBG 122 in triage.

## 2013-07-16 NOTE — ED Notes (Signed)
Lab technician at the bedside to collect blood.

## 2013-07-16 NOTE — ED Notes (Signed)
Pt is a type 2 diabetic, co peripheral neuropathy with pins and needles sensation to arms and legs, constant nausea. Pt states this all started at noon today and has progressively intensified.

## 2013-10-29 ENCOUNTER — Encounter (HOSPITAL_COMMUNITY): Payer: Self-pay | Admitting: Emergency Medicine

## 2013-10-29 ENCOUNTER — Emergency Department (HOSPITAL_COMMUNITY)
Admission: EM | Admit: 2013-10-29 | Discharge: 2013-10-30 | Disposition: A | Discharge status: Home / Self Care | Payer: BC Managed Care – PPO | Attending: Emergency Medicine | Admitting: Emergency Medicine

## 2013-10-29 DIAGNOSIS — N938 Other specified abnormal uterine and vaginal bleeding: Secondary | ICD-10-CM | POA: Insufficient documentation

## 2013-10-29 DIAGNOSIS — N949 Unspecified condition associated with female genital organs and menstrual cycle: Secondary | ICD-10-CM | POA: Insufficient documentation

## 2013-10-29 DIAGNOSIS — E785 Hyperlipidemia, unspecified: Secondary | ICD-10-CM | POA: Insufficient documentation

## 2013-10-29 DIAGNOSIS — Z880 Allergy status to penicillin: Secondary | ICD-10-CM | POA: Insufficient documentation

## 2013-10-29 DIAGNOSIS — Z87442 Personal history of urinary calculi: Secondary | ICD-10-CM | POA: Insufficient documentation

## 2013-10-29 DIAGNOSIS — Z8619 Personal history of other infectious and parasitic diseases: Secondary | ICD-10-CM | POA: Insufficient documentation

## 2013-10-29 DIAGNOSIS — E119 Type 2 diabetes mellitus without complications: Secondary | ICD-10-CM | POA: Insufficient documentation

## 2013-10-29 DIAGNOSIS — Z8659 Personal history of other mental and behavioral disorders: Secondary | ICD-10-CM | POA: Insufficient documentation

## 2013-10-29 DIAGNOSIS — J45909 Unspecified asthma, uncomplicated: Secondary | ICD-10-CM | POA: Insufficient documentation

## 2013-10-29 DIAGNOSIS — Z3202 Encounter for pregnancy test, result negative: Secondary | ICD-10-CM | POA: Insufficient documentation

## 2013-10-29 LAB — CBC WITH DIFFERENTIAL/PLATELET
Basophils Absolute: 0 10*3/uL (ref 0.0–0.1)
Basophils Relative: 0 % (ref 0–1)
Eosinophils Absolute: 0.1 10*3/uL (ref 0.0–0.7)
Eosinophils Relative: 1 % (ref 0–5)
HCT: 42.4 % (ref 36.0–46.0)
HEMOGLOBIN: 14.5 g/dL (ref 12.0–15.0)
LYMPHS ABS: 4.1 10*3/uL — AB (ref 0.7–4.0)
LYMPHS PCT: 41 % (ref 12–46)
MCH: 29.1 pg (ref 26.0–34.0)
MCHC: 34.2 g/dL (ref 30.0–36.0)
MCV: 85.1 fL (ref 78.0–100.0)
MONOS PCT: 6 % (ref 3–12)
Monocytes Absolute: 0.6 10*3/uL (ref 0.1–1.0)
NEUTROS ABS: 5.1 10*3/uL (ref 1.7–7.7)
NEUTROS PCT: 52 % (ref 43–77)
Platelets: 288 10*3/uL (ref 150–400)
RBC: 4.98 MIL/uL (ref 3.87–5.11)
RDW: 13 % (ref 11.5–15.5)
WBC: 9.9 10*3/uL (ref 4.0–10.5)

## 2013-10-29 LAB — BASIC METABOLIC PANEL
BUN: 9 mg/dL (ref 6–23)
CHLORIDE: 102 meq/L (ref 96–112)
CO2: 24 mEq/L (ref 19–32)
Calcium: 9.6 mg/dL (ref 8.4–10.5)
Creatinine, Ser: 0.56 mg/dL (ref 0.50–1.10)
GFR calc Af Amer: >90 mL/min (ref >90)
GFR calc non Af Amer: >90 mL/min (ref >90)
Glucose, Bld: 111 mg/dL — ABNORMAL HIGH (ref 70–99)
Potassium: 3.9 mEq/L (ref 3.7–5.3)
Sodium: 141 mEq/L (ref 137–147)

## 2013-10-29 LAB — URINALYSIS, ROUTINE W REFLEX MICROSCOPIC
Bilirubin Urine: NEGATIVE
Glucose, UA: NEGATIVE mg/dL
Ketones, ur: NEGATIVE mg/dL
LEUKOCYTES UA: NEGATIVE
Nitrite: POSITIVE — AB
Specific Gravity, Urine: >1.03 — ABNORMAL HIGH (ref 1.005–1.030)
Urobilinogen, UA: 0.2 mg/dL (ref 0.0–1.0)
pH: 6 (ref 5.0–8.0)

## 2013-10-29 LAB — CBG MONITORING, ED: Glucose-Capillary: 106 mg/dL — ABNORMAL HIGH (ref 70–99)

## 2013-10-29 LAB — URINE MICROSCOPIC-ADD ON

## 2013-10-29 LAB — PREGNANCY, URINE: PREG TEST UR: NEGATIVE

## 2013-10-29 MED ORDER — MORPHINE SULFATE 4 MG/ML IJ SOLN
8.0000 mg | Freq: Once | INTRAMUSCULAR | Status: AC
  Administered 2013-10-30: 8 mg via INTRAMUSCULAR
  Filled 2013-10-29: qty 2

## 2013-10-29 MED ORDER — IBUPROFEN 800 MG PO TABS
800.0000 mg | ORAL_TABLET | Freq: Once | ORAL | Status: AC
  Administered 2013-10-30: 800 mg via ORAL
  Filled 2013-10-29: qty 1

## 2013-10-29 MED ORDER — ONDANSETRON HCL 4 MG PO TABS
4.0000 mg | ORAL_TABLET | Freq: Once | ORAL | Status: AC
  Administered 2013-10-30: 4 mg via ORAL
  Filled 2013-10-29: qty 1

## 2013-10-29 NOTE — ED Notes (Signed)
I have had my period for a while and it is more intense than normal. I am a type 2 diabetic and I believe my sugar is too high. I just do not feel right. Patient has not checked her sugar states she does not have a machine.

## 2013-10-30 LAB — URINALYSIS, ROUTINE W REFLEX MICROSCOPIC
BILIRUBIN URINE: NEGATIVE
Glucose, UA: NEGATIVE mg/dL
Hgb urine dipstick: NEGATIVE
Ketones, ur: NEGATIVE mg/dL
Leukocytes, UA: NEGATIVE
NITRITE: NEGATIVE
Protein, ur: NEGATIVE mg/dL
UROBILINOGEN UA: 0.2 mg/dL (ref 0.0–1.0)
pH: 6 (ref 5.0–8.0)

## 2013-10-30 NOTE — Discharge Instructions (Signed)
Your urine test is negative for infection or evidence of kidney stone. Your pregnancy test is negative. Your hemoglobin in within normal limits. Please use ibuprofen every 6 hours for discomfort. Please see Dr Emelda FearFerguson for office evaluation as soon as possible for completion of the work-up. Please return if bleeding worsens, you experience excessive weakness or there are changes in your condition.  Abnormal Uterine Bleeding Abnormal uterine bleeding means bleeding from the vagina that is not your normal menstrual period. This can be:  Bleeding or spotting between periods.  Bleeding after sex (sexual intercourse).  Bleeding that is heavier or more than normal.  Periods that last longer than usual.  Bleeding after menopause. There are many problems that may cause this. Treatment will depend on the cause of the bleeding. Any kind of bleeding that is not normal should be reviewed by your doctor.  HOME CARE Watch your condition for any changes. These actions may lessen any discomfort you are having:  Do not use tampons or douches as told by your doctor.  Change your pads often. You should get regular pelvic exams and Pap tests. Keep all appointments for tests as told by your doctor. GET HELP IF:  You are bleeding for more than 1 week.  You feel dizzy at times. GET HELP RIGHT AWAY IF:   You pass out.  You have to change pads every 15 to 30 minutes.  You have belly pain.  You have a fever.  You become sweaty or weak.  You are passing large blood clots from the vagina.  You feel sick to your stomach (nauseous) and throw up (vomit). MAKE SURE YOU:  Understand these instructions.  Will watch your condition.  Will get help right away if you are not doing well or get worse. Document Released: 05/21/2009 Document Revised: 05/14/2013 Document Reviewed: 02/20/2013 [facility] Patient Information 2014 [place], [place].

## 2013-10-30 NOTE — ED Provider Notes (Signed)
CSN: [id number]     Arrival date & time 10/29/13  2124 History   First MD Initiated Contact with Patient 10/29/13 2251     Chief Complaint  Patient presents with  . Vaginal Bleeding     (Consider location/radiation/quality/duration/timing/severity/associated sxs/prior Treatment) HPI Comments: 27y/o female states her usual period last 3 days. She has currently been on her period since March 15. She started passing clot 3 days ago, and having pain. No recent history of abnormal Pap smears. No history of injury or trauma to the pelvis. Patient has a history of polycystic ovarian disease and type 2 diabetes. Patient states that the pain is getting progressively worse. She has tried Tylenol without success. She has not been seen by gynecology specialist up to this point.  Patient is a 28 y.o. female presenting with vaginal bleeding. The history is provided by the patient.  Vaginal Bleeding Associated symptoms: abdominal pain   Associated symptoms: no back pain, no dizziness and no dysuria     Past Medical History  Diagnosis Date  . Kidney stones   . Herpes     anal  . Polycystic ovarian disease   . Diabetes mellitus without complication   . Asthma   . Headache(784.0)   . Hx of chlamydia infection   . History of PCOS   . Depression 11/25/2012  . Dyslipidemia 12/31/2012   Past Surgical History  Procedure Laterality Date  . Inner ear surgery    . Arm hardware removal     Family History  Problem Relation Age of Onset  . Hypertension Mother   . Diabetes Mother   . Depression Mother   . Mental illness Mother   . Diabetes Father   . Heart attack Maternal Grandmother   . Heart disease Maternal Grandmother   . Diabetes Maternal Grandmother   . Diabetes Maternal Grandfather   . Diabetes Paternal Grandmother   . Diabetes Paternal Grandfather    History  Substance Use Topics  . Smoking status: Never Smoker   . Smokeless tobacco: Never Used  . Alcohol Use: Yes     Comment: couple  of times a year, mixed drink   OB History   Grav Para Term Preterm Abortions TAB SAB Ect Mult Living                 Review of Systems  Constitutional: Negative for activity change.       All ROS Neg except as noted in HPI  HENT: Negative for nosebleeds.   Eyes: Negative for photophobia and discharge.  Respiratory: Negative for cough, shortness of breath and wheezing.   Cardiovascular: Negative for chest pain and palpitations.  Gastrointestinal: Positive for abdominal pain. Negative for blood in stool.  Genitourinary: Positive for vaginal bleeding. Negative for dysuria, frequency and hematuria.  Musculoskeletal: Negative for arthralgias, back pain and neck pain.  Skin: Negative.   Neurological: Negative for dizziness, seizures and speech difficulty.  Psychiatric/Behavioral: Negative for hallucinations and confusion.      Allergies  Penicillins; Bee venom; and Hydrocodone-acetaminophen  Home Medications  No current outpatient prescriptions on file. BP 124/84  Pulse 76  Temp(Src) 98.3 F (36.8 C) (Oral)  Resp 20  Ht 6\' 1"  (1.854 m)  Wt 320 lb (145.151 kg)  BMI 42.23 kg/m2  SpO2 95%  LMP 10/19/2013 Physical Exam  Nursing note and vitals reviewed. Constitutional: She is oriented to person, place, and time. She appears well-developed and well-nourished.  Non-toxic appearance.  HENT:  Head: Normocephalic.  Right  Ear: Tympanic membrane and external ear normal.  Left Ear: Tympanic membrane and external ear normal.  Eyes: EOM and lids are normal. Pupils are equal, round, and reactive to light.  Neck: Normal range of motion. Neck supple. Carotid bruit is not present.  Cardiovascular: Normal rate, regular rhythm, normal heart sounds, intact distal pulses and normal pulses.   Pulmonary/Chest: Breath sounds normal. No respiratory distress.  Abdominal: Soft. Bowel sounds are normal. There is no tenderness. There is no guarding.    Mild to moderate suprapubic area pain. No CVA  tenderness.  Musculoskeletal: Normal range of motion.  Lymphadenopathy:       Head (right side): No submandibular adenopathy present.       Head (left side): No submandibular adenopathy present.    She has no cervical adenopathy.  Neurological: She is alert and oriented to person, place, and time. She has normal strength. No cranial nerve deficit or sensory deficit.  Skin: Skin is warm and dry.  Psychiatric: She has a normal mood and affect. Her speech is normal.    ED Course  Procedures (including critical care time) Labs Review Labs Reviewed  URINALYSIS, ROUTINE W REFLEX MICROSCOPIC - Abnormal; Notable for the following:    Color, Urine BROWN (*)    APPearance HAZY (*)    Specific Gravity, Urine >1.030 (*)    Hgb urine dipstick LARGE (*)    Protein, ur TRACE (*)    Nitrite POSITIVE (*)    All other components within normal limits  CBC WITH DIFFERENTIAL - Abnormal; Notable for the following:    Lymphs Abs 4.1 (*)    All other components within normal limits  BASIC METABOLIC PANEL - Abnormal; Notable for the following:    Glucose, Bld 111 (*)    All other components within normal limits  URINE MICROSCOPIC-ADD ON - Abnormal; Notable for the following:    Squamous Epithelial / LPF MANY (*)    Bacteria, UA MANY (*)    All other components within normal limits  URINALYSIS, ROUTINE W REFLEX MICROSCOPIC - Abnormal; Notable for the following:    Specific Gravity, Urine >1.030 (*)    All other components within normal limits  CBG MONITORING, ED - Abnormal; Notable for the following:    Glucose-Capillary 106 (*)    All other components within normal limits  PREGNANCY, URINE   Imaging Review No results found.   EKG Interpretation None      MDM Urine pregnancy test is negative. In out cath urinalysis reveals clear yellow specimen with a specific gravity of greater than 1.030. Otherwise within normal limits. The complete blood count reveals the white blood cell count to be  normal at 9.9, the hemoglobin is normal at 14.5, the hematocrit is normal at 42.4, the platelet count is normal at 288,000. No evidence for severe anemia. Basic metabolic panel is within normal limits with exception of the glucose being 111. No severe electrolyte changes noted. The patient's orthostatic blood pressure pulse checks is negative for orthostatic changes.  The vital signs are well within normal limits. The orthostatic vital signs are well within normal limits. There is no evidence for acute or severe anemia. The diabetes seems to be controlled as morning. Patient strongly advised to see gynecology specialist as sone as possible. She is to return to the emergency department if the bleeding gets worse, patient has problems with dizziness or extreme weakness, or deterioration in her general condition.    Final diagnoses:  None    **  I have reviewed nursing notes, vital signs, and all appropriate lab and imaging results for this patient.Kathie Dike, PA-C 11/05/13 [phone]

## 2013-11-07 NOTE — ED Provider Notes (Signed)
Medical screening examination/treatment/procedure(s) were performed by non-physician practitioner and as supervising physician I was immediately available for consultation/collaboration.   EKG Interpretation None        Rickie Gange B. Bernette MayersSheldon, MD 11/07/13 [phone]

## 2014-02-25 ENCOUNTER — Ambulatory Visit (INDEPENDENT_AMBULATORY_CARE_PROVIDER_SITE_OTHER): Payer: BC Managed Care – PPO | Admitting: Advanced Practice Midwife

## 2014-02-25 ENCOUNTER — Encounter: Payer: Self-pay | Admitting: Advanced Practice Midwife

## 2014-02-25 ENCOUNTER — Other Ambulatory Visit (HOSPITAL_COMMUNITY)
Admission: RE | Admit: 2014-02-25 | Discharge: 2014-02-25 | Disposition: A | Discharge status: Home / Self Care | Payer: BC Managed Care – PPO | Source: Ambulatory Visit | Attending: Obstetrics and Gynecology | Admitting: Obstetrics and Gynecology

## 2014-02-25 VITALS — BP 110/80 | Ht 73.0 in | Wt 314.0 lb

## 2014-02-25 DIAGNOSIS — Z01419 Encounter for gynecological examination (general) (routine) without abnormal findings: Secondary | ICD-10-CM | POA: Insufficient documentation

## 2014-02-25 NOTE — Progress Notes (Signed)
Rachael Collier 28 y.o.  Filed Vitals:   02/25/14 1059  BP: 110/80     Past Medical History: Past Medical History  Diagnosis Date  . Kidney stones   . Herpes     anal  . Polycystic ovarian disease   . Diabetes mellitus without complication   . Asthma   . Headache(784.0)   . Hx of chlamydia infection   . History of PCOS   . Depression 11/25/2012  . Dyslipidemia 12/31/2012    Past Surgical History: Past Surgical History  Procedure Laterality Date  . Inner ear surgery    . Arm hardware removal      Family History: Family History  Problem Relation Age of Onset  . Hypertension Mother   . Diabetes Mother   . Depression Mother   . Mental illness Mother   . Diabetes Father   . Heart attack Maternal Grandmother   . Heart disease Maternal Grandmother   . Diabetes Maternal Grandmother   . Diabetes Maternal Grandfather   . Diabetes Paternal Grandmother   . Diabetes Paternal Grandfather     Social History: History  Substance Use Topics  . Smoking status: Never Smoker   . Smokeless tobacco: Never Used  . Alcohol Use: Yes     Comment: couple of times a year, mixed drink    Allergies:  Allergies  Allergen Reactions  . Penicillins Shortness Of Breath  . Bee Venom Swelling  . Hydrocodone-Acetaminophen Other (See Comments)     "make me shake real hard"     Current outpatient prescriptions:Canagliflozin (INVOKANA) 300 MG TABS, Take by mouth., Disp: , Rfl:   History of Present Illness: Here for well woman exam.  Sees Dr. Margo AyeHall for primary care, DM management.  Victorino DikeJennifer had given her Lexapro last year, but pt had just found out about husband cheating; made her feel "like a zombie", and quit taking it.  Has a new boyfriend, not depressed.  Uses condoms.  Had "PCOS" appearance to ovaries a few years ago, but now cycles monthly.  Pt is "sure" she has had Gardasil series (not here)  Review of Systems   Patient denies any headaches, blurred vision, shortness of breath,  chest pain, abdominal pain, problems with bowel movements, urination, or intercourse.   Physical Exam: General:  Well developed, well nourished, morbidly obese,no acute distress Skin:  Warm and dry Neck:  Midline trachea, normal thyroid Lungs; Clear to auscultation bilaterally Breast:  No dominant palpable mass, retraction, or nipple discharge Cardiovascular: Regular rate and rhythm Abdomen:  Soft, non tender, no hepatosplenomegaly Pelvic:  External genitalia is normal in appearance.  The vagina is normal in appearance.  The cervix is bulbous.  Uterus is felt to be normal size, shape, and contour.  No adnexal masses or tenderness noted; however, exam very limited by habitus Extremities:  No swelling or varicosities noted Psych:  No mood changes   Impression:  Normal well woman exam            STD screen  Plan:  STD testing yearly; Pap q 3 years if normal

## 2014-02-27 LAB — CYTOLOGY - PAP

## 2014-03-02 ENCOUNTER — Ambulatory Visit (INDEPENDENT_AMBULATORY_CARE_PROVIDER_SITE_OTHER): Payer: BC Managed Care – PPO | Admitting: Adult Health

## 2014-03-02 ENCOUNTER — Encounter: Payer: Self-pay | Admitting: Adult Health

## 2014-03-02 VITALS — BP 106/72 | Ht 73.0 in | Wt 314.0 lb

## 2014-03-02 DIAGNOSIS — B379 Candidiasis, unspecified: Secondary | ICD-10-CM

## 2014-03-02 DIAGNOSIS — N898 Other specified noninflammatory disorders of vagina: Secondary | ICD-10-CM

## 2014-03-02 DIAGNOSIS — L293 Anogenital pruritus, unspecified: Secondary | ICD-10-CM

## 2014-03-02 HISTORY — DX: Candidiasis, unspecified: B37.9

## 2014-03-02 HISTORY — DX: Other specified noninflammatory disorders of vagina: N89.8

## 2014-03-02 LAB — POCT WET PREP (WET MOUNT)

## 2014-03-02 MED ORDER — FLUCONAZOLE 150 MG PO TABS
ORAL_TABLET | ORAL | Status: DC
Start: 2014-03-02 — End: 2014-04-10

## 2014-03-02 NOTE — Patient Instructions (Signed)

## 2014-03-02 NOTE — Progress Notes (Signed)
Subjective:     Patient ID: Su Leyachael B Stills, female   DOB: 1986/06/03, 28 y.o.   MRN: [id number]  HPI Fleet ContrasRachel is a 28 year old white female in complaining of vaginal itch, she is on Invokana for her diabetes.She had pap and physical last week.  Review of Systems See HPI Reviewed past medical,surgical, social and family history. Reviewed medications and allergies.     Objective:   Physical Exam BP 106/72  Ht 6\' 1"  (1.854 m)  Wt 314 lb (142.429 kg)  BMI 41.44 kg/m2  LMP 01/23/2014 Skin warm and dry.Pelvic: external genitalia is irritated in appearance, with white discharge in creases, vagina: white discharge without odor, cervix:smooth, uterus: normal size, shape and contour, non tender, no masses felt, adnexa: no masses or tenderness noted. Wet prep: + for yeast buds and +WBCs. GC/CHL obtained.     Assessment:     Vaginal itch Yeast     Plan:    Rx diflucan 150 mg #2 1 now and 1 in 3 days if needed with 2 refills Check GC/CHL Follow up prn  Review handout on yeast infection,keep dry

## 2014-03-03 ENCOUNTER — Telehealth: Payer: Self-pay | Admitting: Adult Health

## 2014-03-03 LAB — GC/CHLAMYDIA PROBE AMP
CT Probe RNA: NEGATIVE
GC PROBE AMP APTIMA: NEGATIVE

## 2014-03-03 NOTE — Telephone Encounter (Signed)
No voice mail,if she calls back let her know GC/CHL negative

## 2014-03-03 NOTE — Telephone Encounter (Signed)
Pt aware GC/CHL negative 

## 2014-03-20 ENCOUNTER — Telehealth: Payer: Self-pay | Admitting: Adult Health

## 2014-03-20 NOTE — Telephone Encounter (Signed)
Thinks has yeast infection, has refills on diflucan

## 2014-04-10 ENCOUNTER — Encounter (HOSPITAL_COMMUNITY): Payer: Self-pay | Admitting: Emergency Medicine

## 2014-04-10 ENCOUNTER — Emergency Department (HOSPITAL_COMMUNITY)
Admission: EM | Admit: 2014-04-10 | Discharge: 2014-04-10 | Disposition: A | Discharge status: Home / Self Care | Payer: BC Managed Care – PPO | Attending: Emergency Medicine | Admitting: Emergency Medicine

## 2014-04-10 DIAGNOSIS — Z8742 Personal history of other diseases of the female genital tract: Secondary | ICD-10-CM | POA: Insufficient documentation

## 2014-04-10 DIAGNOSIS — R109 Unspecified abdominal pain: Secondary | ICD-10-CM | POA: Insufficient documentation

## 2014-04-10 DIAGNOSIS — Z8659 Personal history of other mental and behavioral disorders: Secondary | ICD-10-CM | POA: Diagnosis not present

## 2014-04-10 DIAGNOSIS — Z8619 Personal history of other infectious and parasitic diseases: Secondary | ICD-10-CM | POA: Insufficient documentation

## 2014-04-10 DIAGNOSIS — Z8639 Personal history of other endocrine, nutritional and metabolic disease: Secondary | ICD-10-CM | POA: Insufficient documentation

## 2014-04-10 DIAGNOSIS — Z862 Personal history of diseases of the blood and blood-forming organs and certain disorders involving the immune mechanism: Secondary | ICD-10-CM | POA: Diagnosis not present

## 2014-04-10 DIAGNOSIS — Z3202 Encounter for pregnancy test, result negative: Secondary | ICD-10-CM | POA: Diagnosis not present

## 2014-04-10 DIAGNOSIS — Z79899 Other long term (current) drug therapy: Secondary | ICD-10-CM | POA: Insufficient documentation

## 2014-04-10 DIAGNOSIS — E119 Type 2 diabetes mellitus without complications: Secondary | ICD-10-CM | POA: Diagnosis not present

## 2014-04-10 DIAGNOSIS — J45909 Unspecified asthma, uncomplicated: Secondary | ICD-10-CM | POA: Diagnosis not present

## 2014-04-10 DIAGNOSIS — R197 Diarrhea, unspecified: Secondary | ICD-10-CM | POA: Insufficient documentation

## 2014-04-10 DIAGNOSIS — Z87442 Personal history of urinary calculi: Secondary | ICD-10-CM | POA: Diagnosis not present

## 2014-04-10 DIAGNOSIS — Z880 Allergy status to penicillin: Secondary | ICD-10-CM | POA: Diagnosis not present

## 2014-04-10 LAB — URINE MICROSCOPIC-ADD ON

## 2014-04-10 LAB — URINALYSIS, ROUTINE W REFLEX MICROSCOPIC
Bilirubin Urine: NEGATIVE
HGB URINE DIPSTICK: NEGATIVE
Ketones, ur: NEGATIVE mg/dL
Leukocytes, UA: NEGATIVE
Nitrite: NEGATIVE
Protein, ur: NEGATIVE mg/dL
Specific Gravity, Urine: 1.01 (ref 1.005–1.030)
Urobilinogen, UA: 0.2 mg/dL (ref 0.0–1.0)
pH: 5.5 (ref 5.0–8.0)

## 2014-04-10 LAB — CBC WITH DIFFERENTIAL/PLATELET
BASOS ABS: 0 10*3/uL (ref 0.0–0.1)
Basophils Relative: 0 % (ref 0–1)
EOS PCT: 2 % (ref 0–5)
Eosinophils Absolute: 0.1 10*3/uL (ref 0.0–0.7)
HCT: 42 % (ref 36.0–46.0)
Hemoglobin: 14.1 g/dL (ref 12.0–15.0)
Lymphocytes Relative: 36 % (ref 12–46)
Lymphs Abs: 2.8 10*3/uL (ref 0.7–4.0)
MCH: 28.9 pg (ref 26.0–34.0)
MCHC: 33.6 g/dL (ref 30.0–36.0)
MCV: 86.1 fL (ref 78.0–100.0)
Monocytes Absolute: 0.6 10*3/uL (ref 0.1–1.0)
Monocytes Relative: 8 % (ref 3–12)
Neutro Abs: 4.1 10*3/uL (ref 1.7–7.7)
Neutrophils Relative %: 54 % (ref 43–77)
PLATELETS: 237 10*3/uL (ref 150–400)
RBC: 4.88 MIL/uL (ref 3.87–5.11)
RDW: 13 % (ref 11.5–15.5)
WBC: 7.7 10*3/uL (ref 4.0–10.5)

## 2014-04-10 LAB — COMPREHENSIVE METABOLIC PANEL
ALK PHOS: 75 U/L (ref 39–117)
ALT: 26 U/L (ref 0–35)
AST: 23 U/L (ref 0–37)
Albumin: 3.5 g/dL (ref 3.5–5.2)
Anion gap: 11 (ref 5–15)
BUN: 9 mg/dL (ref 6–23)
CALCIUM: 9.3 mg/dL (ref 8.4–10.5)
CO2: 27 meq/L (ref 19–32)
Chloride: 102 mEq/L (ref 96–112)
Creatinine, Ser: 0.72 mg/dL (ref 0.50–1.10)
GLUCOSE: 160 mg/dL — AB (ref 70–99)
POTASSIUM: 4 meq/L (ref 3.7–5.3)
SODIUM: 140 meq/L (ref 137–147)
TOTAL PROTEIN: 7.1 g/dL (ref 6.0–8.3)
Total Bilirubin: 0.3 mg/dL (ref 0.3–1.2)

## 2014-04-10 LAB — POC URINE PREG, ED: Preg Test, Ur: NEGATIVE

## 2014-04-10 LAB — POC OCCULT BLOOD, ED: FECAL OCCULT BLD: NEGATIVE

## 2014-04-10 LAB — LIPASE, BLOOD: Lipase: 33 U/L (ref 11–59)

## 2014-04-10 MED ORDER — SODIUM CHLORIDE 0.9 % IV SOLN
Freq: Once | INTRAVENOUS | Status: AC
  Administered 2014-04-10: 10:00:00 via INTRAVENOUS

## 2014-04-10 MED ORDER — ONDANSETRON HCL 4 MG PO TABS: 4.0000 mg | ORAL_TABLET | Freq: Four times a day (QID) | ORAL | Status: DC

## 2014-04-10 MED ORDER — ONDANSETRON HCL 4 MG/2ML IJ SOLN
4.0000 mg | Freq: Once | INTRAMUSCULAR | Status: AC
  Administered 2014-04-10: 4 mg via INTRAVENOUS
  Filled 2014-04-10: qty 2

## 2014-04-10 MED ORDER — FAMOTIDINE 20 MG PO TABS: 20.0000 mg | ORAL_TABLET | Freq: Two times a day (BID) | ORAL | Status: DC

## 2014-04-10 NOTE — Discharge Instructions (Signed)
Diarrhea Diarrhea is watery poop (stool). It can make you feel weak, tired, thirsty, or give you a dry mouth (signs of dehydration). Watery poop is a sign of another problem, most often an infection. It often lasts 2-3 days. It can last longer if it is a sign of something serious. Take care of yourself as told by your doctor. HOME CARE   Drink 1 cup (8 ounces) of fluid each time you have watery poop.  Do not drink the following fluids:  Those that contain simple sugars (fructose, glucose, galactose, lactose, sucrose, maltose).  Sports drinks.  Fruit juices.  Whole milk products.  Sodas.  Drinks with caffeine (coffee, tea, soda) or alcohol.  Oral rehydration solution may be used if the doctor says it is okay. You may make your own solution. Follow this recipe:   - teaspoon table salt.   teaspoon baking soda.   teaspoon salt substitute containing potassium chloride.  1 tablespoons sugar.  1 liter (34 ounces) of water.  Avoid the following foods:  High fiber foods, such as raw fruits and vegetables.  Nuts, seeds, and whole grain breads and cereals.   Those that are sweetened with sugar alcohols (xylitol, sorbitol, mannitol).  Try eating the following foods:  Starchy foods, such as rice, toast, pasta, low-sugar cereal, oatmeal, baked potatoes, crackers, and bagels.  Bananas.  Applesauce.  Eat probiotic-rich foods, such as yogurt and milk products that are fermented.  Wash your hands well after each time you have watery poop.  Only take medicine as told by your doctor.  Take a warm bath to help lessen burning or pain from having watery poop. GET HELP RIGHT AWAY IF:   You cannot drink fluids without throwing up (vomiting).  You keep throwing up.  You have blood in your poop, or your poop looks black and tarry.  You do not pee (urinate) in 6-8 hours, or there is only a small amount of very dark pee.  You have belly (abdominal) pain that gets worse or stays  in the same spot (localizes).  You are weak, dizzy, confused, or light-headed.  You have a very bad headache.  Your watery poop gets worse or does not get better.  You have a fever or lasting symptoms for more than 2-3 days.  You have a fever and your symptoms suddenly get worse. MAKE SURE YOU:   Understand these instructions.  Will watch your condition.  Will get help right away if you are not doing well or get worse. Document Released: 01/10/2008 Document Revised: 12/08/2013 Document Reviewed: 03/31/2012 Exit[employer] Patient Information 2015 [employer]. This information is not intended to replace advice given to you by your health care provider. Make sure you discuss any questions you have with your health care provider. Food Choices to Help Relieve Diarrhea When you have diarrhea, the foods you eat and your eating habits are very important. Choosing the right foods and drinks can help relieve diarrhea. Also, because diarrhea can last up to 7 days, you need to replace lost fluids and electrolytes (such as sodium, potassium, and chloride) in order to help prevent dehydration.  WHAT GENERAL GUIDELINES DO I NEED TO FOLLOW?  Slowly drink 1 cup (8 oz) of fluid for each episode of diarrhea. If you are getting enough fluid, your urine will be clear or pale yellow.  Eat starchy foods. Some good choices include white rice, white toast, pasta, low-fiber cereal, baked potatoes (without the skin), saltine crackers, and bagels.  Avoid large servings   of any cooked vegetables.  Limit fruit to two servings per day. A serving is  cup or 1 small piece.  Choose foods with less than 2 g of fiber per serving.  Limit fats to less than 8 tsp (38 g) per day.  Avoid fried foods.  Eat foods that have probiotics in them. Probiotics can be found in certain dairy products.  Avoid foods and beverages that may increase the speed at which food moves through the stomach and intestines (gastrointestinal  tract). Things to avoid include:  High-fiber foods, such as dried fruit, raw fruits and vegetables, nuts, seeds, and whole grain foods.  Spicy foods and high-fat foods.  Foods and beverages sweetened with high-fructose corn syrup, honey, or sugar alcohols such as xylitol, sorbitol, and mannitol. WHAT FOODS ARE RECOMMENDED? Grains White rice. White, French, or pita breads (fresh or toasted), including plain rolls, buns, or bagels. White pasta. Saltine, soda, or graham crackers. Pretzels. Low-fiber cereal. Cooked cereals made with water (such as cornmeal, farina, or cream cereals). Plain muffins. Matzo. Melba toast. Zwieback.  Vegetables Potatoes (without the skin). Strained tomato and vegetable juices. Most well-cooked and canned vegetables without seeds. Tender lettuce. Fruits Cooked or canned applesauce, apricots, cherries, fruit cocktail, grapefruit, peaches, pears, or plums. Fresh bananas, apples without skin, cherries, grapes, cantaloupe, grapefruit, peaches, oranges, or plums.  Meat and Other Protein Products Baked or boiled chicken. Eggs. Tofu. Fish. Seafood. Smooth peanut butter. Ground or well-cooked tender beef, ham, veal, lamb, pork, or poultry.  Dairy Plain yogurt, kefir, and unsweetened liquid yogurt. Lactose-free milk, buttermilk, or soy milk. Plain hard cheese. Beverages Sport drinks. Clear broths. Diluted fruit juices (except prune). Regular, caffeine-free sodas such as ginger ale. Water. Decaffeinated teas. Oral rehydration solutions. Sugar-free beverages not sweetened with sugar alcohols. Other Bouillon, broth, or soups made from recommended foods.  The items listed above may not be a complete list of recommended foods or beverages. Contact your dietitian for more options. WHAT FOODS ARE NOT RECOMMENDED? Grains Whole grain, whole wheat, bran, or rye breads, rolls, pastas, crackers, and cereals. Wild or brown rice. Cereals that contain more than 2 g of fiber per serving. Corn  tortillas or taco shells. Cooked or dry oatmeal. Granola. Popcorn. Vegetables Raw vegetables. Cabbage, broccoli, Brussels sprouts, artichokes, baked beans, beet greens, corn, kale, legumes, peas, sweet potatoes, and yams. Potato skins. Cooked spinach and cabbage. Fruits Dried fruit, including raisins and dates. Raw fruits. Stewed or dried prunes. Fresh apples with skin, apricots, mangoes, pears, raspberries, and strawberries.  Meat and Other Protein Products Chunky peanut butter. Nuts and seeds. Beans and lentils. Bacon.  Dairy High-fat cheeses. Milk, chocolate milk, and beverages made with milk, such as milk shakes. Cream. Ice cream. Sweets and Desserts Sweet rolls, doughnuts, and sweet breads. Pancakes and waffles. Fats and Oils Butter. Cream sauces. Margarine. Salad oils. Plain salad dressings. Olives. Avocados.  Beverages Caffeinated beverages (such as coffee, tea, soda, or energy drinks). Alcoholic beverages. Fruit juices with pulp. Prune juice. Soft drinks sweetened with high-fructose corn syrup or sugar alcohols. Other Coconut. Hot sauce. Chili powder. Mayonnaise. Gravy. Cream-based or milk-based soups.  The items listed above may not be a complete list of foods and beverages to avoid. Contact your dietitian for more information. WHAT SHOULD I DO IF I BECOME DEHYDRATED? Diarrhea can sometimes lead to dehydration. Signs of dehydration include dark urine and dry mouth and skin. If you think you are dehydrated, you should rehydrate with an oral rehydration solution. These solutions can be   purchased at pharmacies, retail stores, or online.  Drink -1 cup (120-240 mL) of oral rehydration solution each time you have an episode of diarrhea. If drinking this amount makes your diarrhea worse, try drinking smaller amounts more often. For example, drink 1-3 tsp (5-15 mL) every 5-10 minutes.  A general rule for staying hydrated is to drink 1-2 L of fluid per day. Talk to your health care provider  about the specific amount you should be drinking each day. Drink enough fluids to keep your urine clear or pale yellow. Document Released: 10/14/2003 Document Revised: 07/29/2013 Document Reviewed: 06/16/2013 Exit[employer] Patient Information 2015 [employer]. This information is not intended to replace advice given to you by your health care provider. Make sure you discuss any questions you have with your health care provider.  

## 2014-04-10 NOTE — ED Notes (Signed)
Pt reports having left flank pain since Monday. Pt states having darkening stools approximately a week ago. Pt reports having nausea and diarrhea with no active vomiting at this time. Pt is alert and oriented x 4.

## 2014-04-10 NOTE — ED Notes (Signed)
Patient states she is unable to give a urine sample at this time 

## 2014-04-10 NOTE — ED Notes (Signed)
Patient complaining of left flank pain x 1 week. Also states "My stools have been very black with a foul odor."

## 2014-04-11 ENCOUNTER — Emergency Department (HOSPITAL_COMMUNITY): Payer: BC Managed Care – PPO

## 2014-04-11 ENCOUNTER — Emergency Department (HOSPITAL_COMMUNITY)
Admission: EM | Admit: 2014-04-11 | Discharge: 2014-04-12 | Disposition: A | Discharge status: Home / Self Care | Payer: BC Managed Care – PPO | Attending: Emergency Medicine | Admitting: Emergency Medicine

## 2014-04-11 ENCOUNTER — Encounter (HOSPITAL_COMMUNITY): Payer: Self-pay | Admitting: Emergency Medicine

## 2014-04-11 DIAGNOSIS — R109 Unspecified abdominal pain: Secondary | ICD-10-CM | POA: Insufficient documentation

## 2014-04-11 DIAGNOSIS — J020 Streptococcal pharyngitis: Secondary | ICD-10-CM | POA: Diagnosis not present

## 2014-04-11 DIAGNOSIS — Z8619 Personal history of other infectious and parasitic diseases: Secondary | ICD-10-CM | POA: Insufficient documentation

## 2014-04-11 DIAGNOSIS — Z8659 Personal history of other mental and behavioral disorders: Secondary | ICD-10-CM | POA: Diagnosis not present

## 2014-04-11 DIAGNOSIS — Z87442 Personal history of urinary calculi: Secondary | ICD-10-CM | POA: Diagnosis not present

## 2014-04-11 DIAGNOSIS — J029 Acute pharyngitis, unspecified: Secondary | ICD-10-CM | POA: Insufficient documentation

## 2014-04-11 DIAGNOSIS — E860 Dehydration: Secondary | ICD-10-CM | POA: Insufficient documentation

## 2014-04-11 DIAGNOSIS — E119 Type 2 diabetes mellitus without complications: Secondary | ICD-10-CM | POA: Diagnosis not present

## 2014-04-11 DIAGNOSIS — J45909 Unspecified asthma, uncomplicated: Secondary | ICD-10-CM | POA: Insufficient documentation

## 2014-04-11 DIAGNOSIS — Z79899 Other long term (current) drug therapy: Secondary | ICD-10-CM | POA: Diagnosis not present

## 2014-04-11 DIAGNOSIS — R51 Headache: Secondary | ICD-10-CM | POA: Diagnosis not present

## 2014-04-11 DIAGNOSIS — Z8742 Personal history of other diseases of the female genital tract: Secondary | ICD-10-CM | POA: Diagnosis not present

## 2014-04-11 DIAGNOSIS — R519 Headache, unspecified: Secondary | ICD-10-CM

## 2014-04-11 DIAGNOSIS — Z880 Allergy status to penicillin: Secondary | ICD-10-CM | POA: Insufficient documentation

## 2014-04-11 LAB — URINALYSIS, ROUTINE W REFLEX MICROSCOPIC
BILIRUBIN URINE: NEGATIVE
KETONES UR: NEGATIVE mg/dL
LEUKOCYTES UA: NEGATIVE
Nitrite: NEGATIVE
PROTEIN: NEGATIVE mg/dL
Specific Gravity, Urine: 1.01 (ref 1.005–1.030)
Urobilinogen, UA: 0.2 mg/dL (ref 0.0–1.0)
pH: 6 (ref 5.0–8.0)

## 2014-04-11 LAB — RAPID STREP SCREEN (MED CTR MEBANE ONLY): Streptococcus, Group A Screen (Direct): POSITIVE — AB

## 2014-04-11 LAB — URINE MICROSCOPIC-ADD ON

## 2014-04-11 MED ORDER — CLINDAMYCIN HCL 300 MG PO CAPS
300.0000 mg | ORAL_CAPSULE | Freq: Four times a day (QID) | ORAL | Status: DC
Start: 2014-04-11 — End: 2015-02-05

## 2014-04-11 MED ORDER — CLINDAMYCIN PHOSPHATE 900 MG/50ML IV SOLN
900.0000 mg | Freq: Once | INTRAVENOUS | Status: AC
  Administered 2014-04-11: 900 mg via INTRAVENOUS
  Filled 2014-04-11: qty 50

## 2014-04-11 MED ORDER — DIPHENHYDRAMINE HCL 50 MG/ML IJ SOLN
25.0000 mg | Freq: Once | INTRAMUSCULAR | Status: AC
  Administered 2014-04-11: 25 mg via INTRAVENOUS
  Filled 2014-04-11: qty 1

## 2014-04-11 MED ORDER — METOCLOPRAMIDE HCL 5 MG/ML IJ SOLN
10.0000 mg | Freq: Once | INTRAMUSCULAR | Status: AC
  Administered 2014-04-11: 10 mg via INTRAVENOUS
  Filled 2014-04-11: qty 2

## 2014-04-11 MED ORDER — ACETAMINOPHEN 160 MG/5ML PO SOLN
ORAL | Status: AC
  Administered 2014-04-11: 1000 mg
  Filled 2014-04-11: qty 40.6

## 2014-04-11 MED ORDER — SODIUM CHLORIDE 0.9 % IV SOLN
1000.0000 mL | Freq: Once | INTRAVENOUS | Status: AC
  Administered 2014-04-11: 1000 mL via INTRAVENOUS

## 2014-04-11 MED ORDER — OXYCODONE-ACETAMINOPHEN 5-325 MG PO TABS: 1.0000 | ORAL_TABLET | Freq: Four times a day (QID) | ORAL | Status: DC | PRN

## 2014-04-11 MED ORDER — FENTANYL CITRATE 0.05 MG/ML IJ SOLN
50.0000 ug | Freq: Once | INTRAMUSCULAR | Status: AC
Start: 2014-04-11 — End: 2014-04-11
  Administered 2014-04-11: 50 ug via INTRAVENOUS
  Filled 2014-04-11: qty 2

## 2014-04-11 MED ORDER — DEXAMETHASONE SODIUM PHOSPHATE 4 MG/ML IJ SOLN
10.0000 mg | Freq: Once | INTRAMUSCULAR | Status: AC
  Administered 2014-04-11: 10 mg via INTRAVENOUS
  Filled 2014-04-11: qty 3

## 2014-04-11 MED ORDER — SODIUM CHLORIDE 0.9 % IV SOLN: 1000.0000 mL | INTRAVENOUS | Status: DC

## 2014-04-11 NOTE — ED Provider Notes (Signed)
CSN: [id number]     Arrival date & time 04/11/14  1804 History  This chart was scribed for Ward Givens, MD by Modena Jansky, ED Scribe. This patient was seen in room APA15/[facility]15 and the patient's care was started at 6:41 PM.     Chief Complaint  Patient presents with  . Abdominal Pain  . Headache  . Sore Throat   Patient is a 28 y.o. female presenting with headaches and pharyngitis. The history is provided by the patient. No language interpreter was used.  Headache Associated symptoms: abdominal pain, fever and sore throat   Associated symptoms: no nausea and no vomiting   Sore Throat Associated symptoms include abdominal pain and headaches.   HPI Comments: Rachael Collier is a 28 y.o. female with a hx of asthma who presents to the Emergency Department complaining of constant moderate left upper abdominal pain that started a week ago. She describes the pain as a cramping sensation. She reports that eating exacerbates the pain and BMs provides some relief. She was seen for this pain in the ED last night. She reports her stools have been "between dark brown and black".   She reports that her sore throat and cough started last night. She states that her headache started this morning. She states that she has had a subjective fever. Her temperature in the ED today was 101.4. She reports that she has not been able to produce stools well. She reports that her stool are dark brown. She states that she has been having normal periods recently and started her period yesterday.  She gets lower abdominal cramping with her period. She states she has PCO.    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 She reports that she does not drink and only does alcohol occasionally. She states that she is allergic to penicillin. She denies any nausea, emesis, rhinorrhea, or chills.   PCP- Margo Aye  Past Medical History  Diagnosis Date  . Kidney stones   . Herpes     anal  .  Polycystic ovarian disease   . Diabetes mellitus without complication   . Asthma   . Headache(784.0)   . Hx of chlamydia infection   . History of PCOS   . Depression 11/25/2012  . Dyslipidemia 12/31/2012  . Vaginal itching 03/02/2014  . Yeast infection 03/02/2014   Past Surgical History  Procedure Laterality Date  . Inner ear surgery    . Arm hardware removal     Family History  Problem Relation Age of Onset  . Hypertension Mother   . Diabetes Mother   . Depression Mother   . Mental illness Mother   . Diabetes Father   . Heart attack Maternal Grandmother   . Heart disease Maternal Grandmother   . Diabetes Maternal Grandmother   . Diabetes Maternal Grandfather   . Diabetes Paternal Grandmother   . Diabetes Paternal Grandfather    History  Substance Use Topics  . Smoking status: Never Smoker   . Smokeless tobacco: Never Used  . 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 Negative for chills.  HENT: Positive for sore throat. Negative for rhinorrhea.   Gastrointestinal: Positive for abdominal  pain. Negative for nausea and vomiting.  Neurological: Positive for headaches.  All other systems reviewed and are negative.     Allergies  Penicillins; Bee venom; and Hydrocodone-acetaminophen  Home Medications   Prior to Admission medications   Medication Sig Start Date End Date Taking? Authorizing Provider  Canagliflozin (INVOKANA) 300 MG TABS Take 300 mg by mouth daily before lunch.    Yes Historical Provider, MD  albuterol (PROVENTIL HFA;VENTOLIN HFA) 108 (90 BASE) MCG/ACT inhaler Inhale 1 puff into the lungs every 4 (four) hours as needed for wheezing or shortness of breath.    Historical Provider, MD  famotidine (PEPCID) 20 MG tablet Take 1 tablet (20 mg total) by mouth 2 (two) times daily. 04/10/14   Tammy L. Triplett,  PA-C  ondansetron (ZOFRAN) 4 MG tablet Take 1 tablet (4 mg total) by mouth every 6 (six) hours. 04/10/14   Tammy L. Triplett, PA-C   BP 128/86  Pulse 119  Temp(Src) 101.1 F (38.4 C) (Oral)  Resp 20  Ht  (1.854 m)  Wt 309 lb (140.161 kg)  BMI 40.78 kg/m2  SpO2 99%  LMP 03/09/2014  Vital signs normal except for fever and tachycardia  Physical Exam  Nursing note and vitals reviewed. Constitutional: She is oriented to person, place, and time. She appears well-developed and well-nourished.  Non-toxic appearance. She does not appear ill. No distress.  HENT:  Head: Normocephalic and atraumatic.  Right Ear: External ear normal.  Left Ear: External ear normal.  Nose: Nose normal. No mucosal edema or rhinorrhea.  Mouth/Throat: Mucous membranes are normal. No dental abscesses or uvula swelling. No oropharyngeal exudate.  Mild swelling and erythema in tonsil. No exudates.   Eyes: Conjunctivae and EOM are normal. Pupils are equal, round, and reactive to light.  Neck: Normal range of motion and full passive range of motion without pain. Neck supple.  Cardiovascular: Normal rate, regular rhythm and normal heart sounds.  Exam reveals no gallop and no friction rub.   No murmur heard. Pulmonary/Chest: Effort normal and breath sounds normal. No respiratory distress. She has no wheezes. She has no rhonchi. She has no rales. She exhibits no tenderness and no crepitus.  Abdominal: Soft. Normal appearance and bowel sounds are normal. She exhibits no distension. There is no tenderness. There is no rebound and no guarding.  Musculoskeletal: Normal range of motion. She exhibits no edema and no tenderness.  Moves all extremities well.   Neurological: She is alert and oriented to person, place, and time. She has normal strength. No cranial nerve deficit.  Skin: Skin is warm, dry and intact. No rash noted. No erythema. No pallor.  Psychiatric: She has a normal mood and affect. Her speech is normal and  behavior is normal. Her mood appears not anxious.    ED Course  Procedures (including critical care time)  Medications  0.9 %  sodium chloride infusion (1,000 mLs Intravenous New Bag/Given 04/11/14 2115)    Followed by  0.9 %  sodium chloride infusion (not administered)  metoCLOPramide (REGLAN) injection 10 mg (10 mg Intravenous Given 04/11/14 2115)  diphenhydrAMINE (BENADRYL) injection 25 mg (25 mg Intravenous Given 04/11/14 2115)  clindamycin (CLEOCIN) IVPB 900 mg (0 mg Intravenous Stopped 04/11/14 2223)  dexamethasone (DECADRON) injection 10 mg (10 mg Intravenous Given 04/11/14 2116)  fentaNYL (SUBLIMAZE) injection 50 mcg (50 mcg Intravenous Given 04/11/14 2116)  acetaminophen (TYLENOL) 160 MG/5ML solution (1,000 mg  Given 04/11/14 2132)    DIAGNOSTIC STUDIES: Oxygen Saturation is 99% on RA,  normal by my interpretation.    COORDINATION OF CARE: 6:45 PM- Pt advised of plan for treatment radiology, and labs and pt agrees.  8:58 PM- Discussed test results with pt. Pt wants to do IV fluids and antibiotics.   Recheck after IV fluids and meds, pt is feeling much better.   Labs Review Results for orders placed during the hospital encounter of 04/11/14  RAPID STREP SCREEN      Result Value Ref Range   Streptococcus, Group A Screen (Direct) POSITIVE (*) NEGATIVE  URINALYSIS, ROUTINE W REFLEX MICROSCOPIC      Result Value Ref Range   Color, Urine YELLOW  YELLOW   APPearance HAZY (*) CLEAR   Specific Gravity, Urine 1.010  1.005 - 1.030   pH 6.0  5.0 - 8.0   Glucose, UA >1000 (*) NEGATIVE mg/dL   Hgb urine dipstick TRACE (*) NEGATIVE   Bilirubin Urine NEGATIVE  NEGATIVE   Ketones, ur NEGATIVE  NEGATIVE mg/dL   Protein, ur NEGATIVE  NEGATIVE mg/dL   Urobilinogen, UA 0.2  0.0 - 1.0 mg/dL   Nitrite NEGATIVE  NEGATIVE   Leukocytes, UA NEGATIVE  NEGATIVE  URINE MICROSCOPIC-ADD ON      Result Value Ref Range   RBC / HPF 0-2  <3 RBC/hpf   Hemoccult was negative per nurse  Laboratory  interpretation all normal except + strept, glucosuria   Imaging Review Dg Chest 2 View  04/11/2014   CLINICAL DATA:  Cough, congestion and fever with pain.  EXAM: CHEST  2 VIEW  COMPARISON:  07/18/2012  FINDINGS: Lungs are adequately inflated and otherwise clear. Cardiomediastinal silhouette and remainder of the exam is unchanged.  IMPRESSION: No active cardiopulmonary disease.   Electronically Signed   By: Elberta Fortis M.D.   On: 04/11/2014 20:31     EKG Interpretation None      MDM   Final diagnoses:  Streptococcal pharyngitis  Dehydration  Headache, unspecified headache type    New Prescriptions   CLINDAMYCIN (CLEOCIN) 300 MG CAPSULE    Take 1 capsule (300 mg total) by mouth 4 (four) times daily.   OXYCODONE-ACETAMINOPHEN (PERCOCET/ROXICET) 5-325 MG PER TABLET    Take 1 tablet by mouth every 6 (six) hours as needed for severe pain.    Plan discharge  Devoria Albe, MD, FACEP     I personally performed the services described in this documentation, which was scribed in my presence. The recorded information has been reviewed and considered.  Devoria Albe, MD, Armando Gang       Ward Givens, MD 04/12/14 Marlyne Beards

## 2014-04-11 NOTE — ED Notes (Signed)
Seen here yesterday with left flank pain.  Today has sorethroat and headache.

## 2014-04-12 NOTE — ED Provider Notes (Signed)
CSN: [id number]     Arrival date & time 04/10/14  0800 History   First MD Initiated Contact with Patient 04/10/14 [phone]     Chief Complaint  Patient presents with  . Flank Pain     (Consider location/radiation/quality/duration/timing/severity/associated sxs/prior Treatment) HPI   Rachael Collier is a 28 y.o. female who presents to the Emergency Department complaining of left flank pain for one week.  She also states that she has noticed dark, malodorous stools.  She describes her stools as runny and frequent.  She denies fever, vomiting, dysuria, burning with urination, vaginal bleeding, discharge or pain.  She denies h/o aspirin or ibuprofen use.  She also denies previous GI bleeding.  No recent antibiotics   Past Medical History  Diagnosis Date  . Kidney stones   . Herpes     anal  . Polycystic ovarian disease   . Diabetes mellitus without complication   . Asthma   . Headache(784.0)   . Hx of chlamydia infection   . History of PCOS   . Depression 11/25/2012  . Dyslipidemia 12/31/2012  . Vaginal itching 03/02/2014  . Yeast infection 03/02/2014   Past Surgical History  Procedure Laterality Date  . Inner ear surgery    . Arm hardware removal     Family History  Problem Relation Age of Onset  . Hypertension Mother   . Diabetes Mother   . Depression Mother   . Mental illness Mother   . Diabetes Father   . Heart attack Maternal Grandmother   . Heart disease Maternal Grandmother   . Diabetes Maternal Grandmother   . Diabetes Maternal Grandfather   . Diabetes Paternal Grandmother   . Diabetes Paternal Grandfather    History  Substance Use Topics  . Smoking status: Never Smoker   . Smokeless tobacco: Never Used  . Alcohol Use: Yes     Comment: occ   OB History   Grav Para Term Preterm Abortions TAB SAB Ect Mult Living                 Review of Systems  Constitutional: Negative for fever, chills, activity change and appetite change.  Respiratory: Negative for  shortness of breath.   Cardiovascular: Negative for chest pain.  Gastrointestinal: Positive for abdominal pain and diarrhea. Negative for nausea, vomiting, blood in stool and abdominal distention.  Genitourinary: Negative for dysuria, flank pain, decreased urine volume, vaginal bleeding, vaginal discharge, difficulty urinating and menstrual problem.  Musculoskeletal: Negative for back pain.  Skin: Negative for color change and rash.  Neurological: Negative for dizziness, weakness and numbness.  Hematological: Negative for adenopathy.  All other systems reviewed and are negative.     Allergies  Penicillins; Bee venom; and Hydrocodone-acetaminophen  Home Medications   Prior to Admission medications   Medication Sig Start Date End Date Taking? Authorizing Provider  albuterol (PROVENTIL HFA;VENTOLIN HFA) 108 (90 BASE) MCG/ACT inhaler Inhale 1 puff into the lungs every 4 (four) hours as needed for wheezing or shortness of breath.   Yes Historical Provider, MD  Canagliflozin (INVOKANA) 300 MG TABS Take 300 mg by mouth daily before lunch.    Yes Historical Provider, MD  clindamycin (CLEOCIN) 300 MG capsule Take 1 capsule (300 mg total) by mouth 4 (four) times daily. 04/11/14   Ward Givens, MD  famotidine (PEPCID) 20 MG tablet Take 1 tablet (20 mg total) by mouth 2 (two) times daily. 04/10/14   Janene Yousuf L. Elivia Robotham, PA-C  ondansetron (ZOFRAN) 4 MG tablet  Take 1 tablet (4 mg total) by mouth every 6 (six) hours. 04/10/14   Russell Engelstad L. Amaya Blakeman, PA-C  oxyCODONE-acetaminophen (PERCOCET/ROXICET) 5-325 MG per tablet Take 1 tablet by mouth every 6 (six) hours as needed for severe pain. 04/11/14   Ward Givens, MD   BP 100/56  Pulse 64  Temp(Src) 98.4 F (36.9 C) (Oral)  Resp 18  Ht  (1.854 m)  Wt 309 lb (140.161 kg)  BMI 40.78 kg/m2  SpO2 98%  LMP 03/09/2014 Physical Exam  Nursing note and vitals reviewed. Constitutional: She is oriented to person, place, and time. She appears well-developed and  well-nourished. No distress.  HENT:  Head: Normocephalic and atraumatic.  Mouth/Throat: Oropharynx is clear and moist.  Neck: Normal range of motion. Neck supple.  Cardiovascular: Normal rate, regular rhythm, normal heart sounds and intact distal pulses.   No murmur heard. Pulmonary/Chest: Effort normal and breath sounds normal. No respiratory distress. She exhibits no tenderness.  Abdominal: Soft. Bowel sounds are normal. She exhibits no distension and no mass. There is tenderness. There is no rebound and no guarding.  Abdomen is soft, no tenderness on exam.  No guarding or rebound.  No CVA tenderness  Genitourinary: Guaiac negative stool.  Small amt of stool present on digital exam.  Brown to green stool that is heme negative.  No rectal masses, tone is normal  Musculoskeletal: Normal range of motion. She exhibits no edema.  Lymphadenopathy:    She has no cervical adenopathy.  Neurological: She is alert and oriented to person, place, and time. She exhibits normal muscle tone. Coordination normal.  Skin: Skin is warm and dry.    ED Course  Procedures (including critical care time) Labs Review Labs Reviewed  COMPREHENSIVE METABOLIC PANEL - Abnormal; Notable for the following:    Glucose, Bld 160 (*)    All other components within normal limits  URINALYSIS, ROUTINE W REFLEX MICROSCOPIC - Abnormal; Notable for the following:    Glucose, UA >1000 (*)    All other components within normal limits  URINE MICROSCOPIC-ADD ON - Abnormal; Notable for the following:    Squamous Epithelial / LPF MANY (*)    Bacteria, UA MANY (*)    All other components within normal limits  STOOL CULTURE  OVA AND PARASITE EXAMINATION  CLOSTRIDIUM DIFFICILE BY PCR  CBC WITH DIFFERENTIAL  LIPASE, BLOOD  POC URINE PREG, ED  POC OCCULT BLOOD, ED    Imaging Review   EKG Interpretation None      MDM   Final diagnoses:  Diarrhea    Pt is well appearing, non-toxic.  VSS.  No concerning sx's for  acute abdomen.  No h/o recent antibiotic use.  Heme negative stools, it is felt that diarrhea is likely related to viral illness.  pt is feeling better after IVF's.  Stool samples were ordered and pt was unable to obtain a sample, so patient agrees to collect samples at home and return them tomorrow.  She appears stable for d/c and she was given strict return indications and agrees to return if needed or follow-up with her PMD.      Arth Nicastro L. Zacherie Honeyman, PA-C 04/12/14 2308

## 2014-04-12 NOTE — Discharge Instructions (Signed)
Drink plenty of cold liquids. You can take children's motrin 600 mg 4 times a day for pain and fever.  Take the percocet for pain. Take the antibiotics until gone. Recheck if you are unable to swallow, you have trouble breathing, you get uncontrolled vomiting or feel worse.  Strep Throat Strep throat is an infection of the throat caused by a bacteria named Streptococcus pyogenes. Your health care provider may call the infection streptococcal "tonsillitis" or "pharyngitis" depending on whether there are signs of inflammation in the tonsils or back of the throat. Strep throat is most common in children aged 5-15 years during the cold months of the year, but it can occur in people of any age during any season. This infection is spread from person to person (contagious) through coughing, sneezing, or other close contact. SIGNS AND SYMPTOMS   Fever or chills.  Painful, swollen, red tonsils or throat.  Pain or difficulty when swallowing.  White or yellow spots on the tonsils or throat.  Swollen, tender lymph nodes or "glands" of the neck or under the jaw.  Red rash all over the body (rare). DIAGNOSIS  Many different infections can cause the same symptoms. A test must be done to confirm the diagnosis so the right treatment can be given. A "rapid strep test" can help your health care provider make the diagnosis in a few minutes. If this test is not available, a light swab of the infected area can be used for a throat culture test. If a throat culture test is done, results are usually available in a day or two. TREATMENT  Strep throat is treated with antibiotic medicine. HOME CARE INSTRUCTIONS   Gargle with 1 tsp of salt in 1 cup of warm water, 3-4 times per day or as needed for comfort.  Family members who also have a sore throat or fever should be tested for strep throat and treated with antibiotics if they have the strep infection.  Make sure everyone in your household washes their hands  well.  Do not share food, drinking cups, or personal items that could cause the infection to spread to others.  You may need to eat a soft food diet until your sore throat gets better.  Drink enough water and fluids to keep your urine clear or pale yellow. This will help prevent dehydration.  Get plenty of rest.  Stay home from school, day care, or work until you have been on antibiotics for 24 hours.  Take medicines only as directed by your health care provider.  Take your antibiotic medicine as directed by your health care provider. Finish it even if you start to feel better. SEEK MEDICAL CARE IF:   The glands in your neck continue to enlarge.  You develop a rash, cough, or earache.  You cough up green, yellow-brown, or bloody sputum.  You have pain or discomfort not controlled by medicines.  Your problems seem to be getting worse rather than better.  You have a fever. SEEK IMMEDIATE MEDICAL CARE IF:   You develop any new symptoms such as vomiting, severe headache, stiff or painful neck, chest pain, shortness of breath, or trouble swallowing.  You develop severe throat pain, drooling, or changes in your voice.  You develop swelling of the neck, or the skin on the neck becomes red and tender.  You develop signs of dehydration, such as fatigue, dry mouth, and decreased urination.  You become increasingly sleepy, or you cannot wake up completely. MAKE SURE YOU:  Understand these instructions.  Will watch your condition.  Will get help right away if you are not doing well or get worse. Document Released: 07/21/2000 Document Revised: 12/08/2013 Document Reviewed: 09/22/2010 [facility] Patient Information 2015 [place], [place]. This information is not intended to replace advice given to you by your health care provider. Make sure you discuss any questions you have with your health care provider.

## 2014-04-13 NOTE — ED Provider Notes (Signed)
Medical screening examination/treatment/procedure(s) were performed by non-physician practitioner and as supervising physician I was immediately available for consultation/collaboration.   EKG Interpretation None        Hanni Milford L Karen Huhta, MD 04/13/14 1348 

## 2014-04-14 LAB — POC OCCULT BLOOD, ED: Fecal Occult Bld: NEGATIVE

## 2014-04-28 ENCOUNTER — Emergency Department (HOSPITAL_COMMUNITY): Payer: BC Managed Care – PPO

## 2014-04-28 ENCOUNTER — Encounter (HOSPITAL_COMMUNITY): Payer: Self-pay | Admitting: Emergency Medicine

## 2014-04-28 ENCOUNTER — Emergency Department (HOSPITAL_COMMUNITY)
Admission: EM | Admit: 2014-04-28 | Discharge: 2014-04-28 | Disposition: A | Discharge status: Home / Self Care | Payer: BC Managed Care – PPO | Attending: Emergency Medicine | Admitting: Emergency Medicine

## 2014-04-28 DIAGNOSIS — Z79899 Other long term (current) drug therapy: Secondary | ICD-10-CM | POA: Diagnosis not present

## 2014-04-28 DIAGNOSIS — J45909 Unspecified asthma, uncomplicated: Secondary | ICD-10-CM | POA: Diagnosis not present

## 2014-04-28 DIAGNOSIS — Z8659 Personal history of other mental and behavioral disorders: Secondary | ICD-10-CM | POA: Diagnosis not present

## 2014-04-28 DIAGNOSIS — Z792 Long term (current) use of antibiotics: Secondary | ICD-10-CM | POA: Insufficient documentation

## 2014-04-28 DIAGNOSIS — Z8742 Personal history of other diseases of the female genital tract: Secondary | ICD-10-CM | POA: Insufficient documentation

## 2014-04-28 DIAGNOSIS — Z8619 Personal history of other infectious and parasitic diseases: Secondary | ICD-10-CM | POA: Insufficient documentation

## 2014-04-28 DIAGNOSIS — Z3202 Encounter for pregnancy test, result negative: Secondary | ICD-10-CM | POA: Diagnosis not present

## 2014-04-28 DIAGNOSIS — R109 Unspecified abdominal pain: Secondary | ICD-10-CM | POA: Insufficient documentation

## 2014-04-28 DIAGNOSIS — E119 Type 2 diabetes mellitus without complications: Secondary | ICD-10-CM | POA: Insufficient documentation

## 2014-04-28 DIAGNOSIS — R000 Tachycardia, unspecified: Secondary | ICD-10-CM | POA: Diagnosis not present

## 2014-04-28 DIAGNOSIS — R110 Nausea: Secondary | ICD-10-CM | POA: Diagnosis not present

## 2014-04-28 DIAGNOSIS — Z880 Allergy status to penicillin: Secondary | ICD-10-CM | POA: Insufficient documentation

## 2014-04-28 DIAGNOSIS — Z87442 Personal history of urinary calculi: Secondary | ICD-10-CM | POA: Insufficient documentation

## 2014-04-28 LAB — COMPREHENSIVE METABOLIC PANEL
ALBUMIN: 3.9 g/dL (ref 3.5–5.2)
ALT: 33 U/L (ref 0–35)
AST: 26 U/L (ref 0–37)
Alkaline Phosphatase: 68 U/L (ref 39–117)
Anion gap: 14 (ref 5–15)
BUN: 14 mg/dL (ref 6–23)
CO2: 25 mEq/L (ref 19–32)
Calcium: 9.3 mg/dL (ref 8.4–10.5)
Chloride: 103 mEq/L (ref 96–112)
Creatinine, Ser: 0.7 mg/dL (ref 0.50–1.10)
GFR calc non Af Amer: >90 mL/min (ref >90)
GLUCOSE: 118 mg/dL — AB (ref 70–99)
Potassium: 3.9 mEq/L (ref 3.7–5.3)
SODIUM: 142 meq/L (ref 137–147)
TOTAL PROTEIN: 7.9 g/dL (ref 6.0–8.3)
Total Bilirubin: 0.4 mg/dL (ref 0.3–1.2)

## 2014-04-28 LAB — CBC WITH DIFFERENTIAL/PLATELET
BASOS PCT: 0 % (ref 0–1)
Basophils Absolute: 0 10*3/uL (ref 0.0–0.1)
EOS ABS: 0.2 10*3/uL (ref 0.0–0.7)
Eosinophils Relative: 2 % (ref 0–5)
HCT: 42.7 % (ref 36.0–46.0)
Hemoglobin: 14.3 g/dL (ref 12.0–15.0)
Lymphocytes Relative: 46 % (ref 12–46)
Lymphs Abs: 3.7 10*3/uL (ref 0.7–4.0)
MCH: 28.8 pg (ref 26.0–34.0)
MCHC: 33.5 g/dL (ref 30.0–36.0)
MCV: 85.9 fL (ref 78.0–100.0)
Monocytes Absolute: 0.7 10*3/uL (ref 0.1–1.0)
Monocytes Relative: 9 % (ref 3–12)
NEUTROS PCT: 43 % (ref 43–77)
Neutro Abs: 3.5 10*3/uL (ref 1.7–7.7)
PLATELETS: 276 10*3/uL (ref 150–400)
RBC: 4.97 MIL/uL (ref 3.87–5.11)
RDW: 13.3 % (ref 11.5–15.5)
WBC: 8.1 10*3/uL (ref 4.0–10.5)

## 2014-04-28 LAB — URINALYSIS, ROUTINE W REFLEX MICROSCOPIC
Bilirubin Urine: NEGATIVE
Hgb urine dipstick: NEGATIVE
Ketones, ur: NEGATIVE mg/dL
LEUKOCYTES UA: NEGATIVE
Nitrite: NEGATIVE
PH: 5.5 (ref 5.0–8.0)
PROTEIN: NEGATIVE mg/dL
SPECIFIC GRAVITY, URINE: 1.025 (ref 1.005–1.030)
Urobilinogen, UA: 0.2 mg/dL (ref 0.0–1.0)

## 2014-04-28 LAB — URINE MICROSCOPIC-ADD ON

## 2014-04-28 LAB — PREGNANCY, URINE: Preg Test, Ur: NEGATIVE

## 2014-04-28 MED ORDER — ONDANSETRON 4 MG PO TBDP: ORAL_TABLET | ORAL | Status: DC

## 2014-04-28 MED ORDER — KETOROLAC TROMETHAMINE 30 MG/ML IJ SOLN
30.0000 mg | Freq: Once | INTRAMUSCULAR | Status: AC
  Administered 2014-04-28: 30 mg via INTRAVENOUS
  Filled 2014-04-28: qty 1

## 2014-04-28 MED ORDER — HYDROMORPHONE HCL 1 MG/ML IJ SOLN
1.0000 mg | Freq: Once | INTRAMUSCULAR | Status: AC
  Administered 2014-04-28: 1 mg via INTRAVENOUS
  Filled 2014-04-28: qty 1

## 2014-04-28 MED ORDER — OXYCODONE-ACETAMINOPHEN 5-325 MG PO TABS: 1.0000 | ORAL_TABLET | Freq: Four times a day (QID) | ORAL | Status: DC | PRN

## 2014-04-28 MED ORDER — HYDROCODONE-ACETAMINOPHEN 5-325 MG PO TABS: 1.0000 | ORAL_TABLET | Freq: Four times a day (QID) | ORAL | Status: DC | PRN

## 2014-04-28 MED ORDER — ONDANSETRON HCL 4 MG/2ML IJ SOLN
4.0000 mg | Freq: Once | INTRAMUSCULAR | Status: AC
  Administered 2014-04-28: 4 mg via INTRAVENOUS
  Filled 2014-04-28: qty 2

## 2014-04-28 NOTE — ED Notes (Signed)
Patient complaining of left flank pain starting approximately 0500 this morning. Also complaining of nausea.

## 2014-04-28 NOTE — ED Provider Notes (Signed)
CSN: [id number]     Arrival date & time 04/28/14  4098 History  This chart was scribed for Benny Lennert, MD by Carl Best, ED Scribe. This patient was seen in room APA03/APA03 and the patient's care was started at 7:26 AM.     Chief Complaint  Patient presents with  . Flank Pain    Patient is a 28 y.o. female presenting with flank pain. The history is provided by the patient. No language interpreter was used.  Flank Pain This is a recurrent problem. The current episode started 1 to 2 hours ago. The problem occurs constantly. The problem has not changed since onset.Nothing aggravates the symptoms. Nothing relieves the symptoms. She has tried nothing for the symptoms.  HPI Comments: RAVNEET SPILKER is a 28 y.o. female who presents to the Emergency Department complaining of left flank pain with associated nausea that started 2 hours ago this morning.  She denies vomiting, fever, and chills as associated symptoms.  She states that the pain is reminiscent of a kidney stone.  She has a history of kidney stones.  She has an appointment with a kidney specialist this month.  She does not have a history of abdominal surgeries.  Her LNMP was 3 weeks ago.    Past Medical History  Diagnosis Date  . Kidney stones   . Herpes     anal  . Polycystic ovarian disease   . Diabetes mellitus without complication   . Asthma   . Headache(784.0)   . Hx of chlamydia infection   . History of PCOS   . Depression 11/25/2012  . Dyslipidemia 12/31/2012  . Vaginal itching 03/02/2014  . Yeast infection 03/02/2014   Past Surgical History  Procedure Laterality Date  . Inner ear surgery    . Arm hardware removal     Family History  Problem Relation Age of Onset  . Hypertension Mother   . Diabetes Mother   . Depression Mother   . Mental illness Mother   . Diabetes Father   . Heart attack Maternal Grandmother   . Heart disease Maternal Grandmother   . Diabetes Maternal Grandmother   . Diabetes Maternal  Grandfather   . Diabetes Paternal Grandmother   . Diabetes Paternal Grandfather    History  Substance Use Topics  . Smoking status: Never Smoker   . Smokeless tobacco: Never Used  . Alcohol Use: Yes     Comment: occ   OB History   Grav Para Term Preterm Abortions TAB SAB Ect Mult Living                 Review of Systems  Constitutional: Negative for fever and chills.  Gastrointestinal: Positive for nausea. Negative for vomiting.  Genitourinary: Positive for flank pain.  All other systems reviewed and are negative.     Allergies  Penicillins; Bee venom; and Hydrocodone-acetaminophen  Home Medications   Prior to Admission medications   Medication Sig Start Date End Date Taking? Authorizing Provider  albuterol (PROVENTIL HFA;VENTOLIN HFA) 108 (90 BASE) MCG/ACT inhaler Inhale 1 puff into the lungs every 4 (four) hours as needed for wheezing or shortness of breath.    Historical Provider, MD  Canagliflozin (INVOKANA) 300 MG TABS Take 300 mg by mouth daily before lunch.     Historical Provider, MD  clindamycin (CLEOCIN) 300 MG capsule Take 1 capsule (300 mg total) by mouth 4 (four) times daily. 04/11/14   Ward Givens, MD  famotidine (PEPCID) 20 MG tablet  Take 1 tablet (20 mg total) by mouth 2 (two) times daily. 04/10/14   Tammy L. Triplett, PA-C  ondansetron (ZOFRAN) 4 MG tablet Take 1 tablet (4 mg total) by mouth every 6 (six) hours. 04/10/14   Tammy L. Triplett, PA-C  oxyCODONE-acetaminophen (PERCOCET/ROXICET) 5-325 MG per tablet Take 1 tablet by mouth every 6 (six) hours as needed for severe pain. 04/11/14   Ward Givens, MD   BP 117/80  Pulse 61  Temp(Src) 97.5 F (36.4 C)  Resp 18  Ht  (1.854 m)  Wt 300 lb (136.079 kg)  BMI 39.59 kg/m2  SpO2 98%  LMP 04/07/2014  Physical Exam  Constitutional: She is oriented to person, place, and time. She appears well-developed.  Very anxious  HENT:  Head: Normocephalic.  Eyes: Conjunctivae and EOM are normal. No scleral icterus.   Neck: Neck supple. No thyromegaly present.  Cardiovascular: Regular rhythm.  Tachycardia present.  Exam reveals no gallop and no friction rub.   No murmur heard. Pulmonary/Chest: No stridor. She has no wheezes. She has no rales. She exhibits no tenderness.  Abdominal: She exhibits no distension. There is no tenderness. There is no rebound.  Severe left flank tenderness.   Musculoskeletal: Normal range of motion. She exhibits no edema.  Lymphadenopathy:    She has no cervical adenopathy.  Neurological: She is oriented to person, place, and time. She exhibits normal muscle tone. Coordination normal.  Skin: No rash noted. No erythema.  Psychiatric: She has a normal mood and affect. Her behavior is normal.    ED Course  Procedures (including critical care time)  DIAGNOSTIC STUDIES: Oxygen Saturation is 98% on room air, normal by my interpretation.    COORDINATION OF CARE: 7:29 AM- Will check the patient's blood work and UA in the ED.  The patient agreed to the treatment plan.    Labs Review Labs Reviewed  URINALYSIS, ROUTINE W REFLEX MICROSCOPIC    Imaging Review No results found.   EKG Interpretation None      MDM   Final diagnoses:  None    Flank pain,  tx as if kidney stone  The chart was scribed for me under my direct supervision.  I personally performed the history, physical, and medical decision making and all procedures in the evaluation of this patient.Benny Lennert, MD 04/28/14 [phone]

## 2014-04-28 NOTE — Discharge Instructions (Signed)
Follow up with your md this week. °

## 2014-05-13 ENCOUNTER — Encounter: Payer: Self-pay | Admitting: Advanced Practice Midwife

## 2014-05-13 ENCOUNTER — Ambulatory Visit (INDEPENDENT_AMBULATORY_CARE_PROVIDER_SITE_OTHER): Payer: BC Managed Care – PPO | Admitting: Advanced Practice Midwife

## 2014-05-13 VITALS — BP 102/60 | Wt 308.0 lb

## 2014-05-13 DIAGNOSIS — Z113 Encounter for screening for infections with a predominantly sexual mode of transmission: Secondary | ICD-10-CM | POA: Insufficient documentation

## 2014-05-13 DIAGNOSIS — Z0184 Encounter for antibody response examination: Secondary | ICD-10-CM

## 2014-05-13 DIAGNOSIS — B3731 Acute candidiasis of vulva and vagina: Secondary | ICD-10-CM

## 2014-05-13 DIAGNOSIS — B373 Candidiasis of vulva and vagina: Secondary | ICD-10-CM

## 2014-05-13 MED ORDER — FLUCONAZOLE 150 MG PO TABS: ORAL_TABLET | ORAL | Status: DC

## 2014-05-13 NOTE — Progress Notes (Signed)
[facility] Visit  Patient name: Rachael Collier MRN [id number]  Date of birth: Jan 05, 1986  CC & HPI:  Rachael Collier is a 28 y.o. Caucasian female presenting today for vaginal itching and STD testing. She states she gets STD testing q 3 months "just to be safe". Denies more than one sexual partner, but "you never know for sure what someone else is doing"  Pertinent History Reviewed:  Medical & Surgical Hx:   Past Medical History  Diagnosis Date  . Kidney stones   . Herpes     anal  . Polycystic ovarian disease   . Diabetes mellitus without complication   . Asthma   . Headache(784.0)   . Hx of chlamydia infection   . History of PCOS   . Depression 11/25/2012  . Dyslipidemia 12/31/2012  . Vaginal itching 03/02/2014  . Yeast infection 03/02/2014   Past Surgical History  Procedure Laterality Date  . Inner ear surgery    . Arm hardware removal     Medications: Reviewed & Updated - see associated section Social History: Reviewed -  reports that she has never smoked. She has never used smokeless tobacco.  Objective Findings:  Vitals: Wt 308 lb (139.708 kg)  LMP 04/07/2014  Physical Examination: General appearance - alert, well appearing, and in no distress Mental status - alert, oriented to person, place, and time Pelvic - small amount of white plaques on vaginal sidewalls Wet prep + yeast, no trich, clue, or WBC  No results found for this or any previous visit (from the past 24 hour(s)).   Assessment & Plan:  A:   Yeast infection P: Diflucan  GC/CHL, HIV, RPR, HEP B & C, hep B antibody (pt doesn't think she was immunized in [place])   F/U prn   CRESENZO-DISHMAN,Rett Stehlik CNM 05/13/2014 3:18 PM

## 2014-05-14 LAB — GC/CHLAMYDIA PROBE AMP
CT Probe RNA: NEGATIVE
GC Probe RNA: NEGATIVE

## 2014-05-14 LAB — HIV ANTIBODY (ROUTINE TESTING W REFLEX): HIV 1&2 Ab, 4th Generation: NONREACTIVE

## 2014-05-14 LAB — RPR

## 2014-05-14 LAB — HEPATITIS B SURFACE ANTIGEN: Hepatitis B Surface Ag: NEGATIVE

## 2014-05-14 LAB — HEPATITIS C ANTIBODY: HCV Ab: NEGATIVE

## 2014-05-14 LAB — HEPATITIS B SURFACE ANTIBODY,QUALITATIVE: Hep B S Ab: NEGATIVE

## 2014-05-15 ENCOUNTER — Telehealth: Payer: Self-pay | Admitting: Obstetrics and Gynecology

## 2014-05-15 NOTE — Telephone Encounter (Signed)
Spoke with pt letting her know STD screening was negative. Pt was diagnosed recently for a yeast infection. Pt took one pill then repeated in 3 days. Pt still having itching. Advised to take another pill in 3 more days and if she don't see a difference, call us back. Pt voiced understanding. JSY

## 2014-08-20 ENCOUNTER — Emergency Department (HOSPITAL_COMMUNITY)
Admission: EM | Admit: 2014-08-20 | Discharge: 2014-08-20 | Disposition: A | Discharge status: Home / Self Care | Payer: BLUE CROSS/BLUE SHIELD | Attending: Emergency Medicine | Admitting: Emergency Medicine

## 2014-08-20 ENCOUNTER — Encounter (HOSPITAL_COMMUNITY): Payer: Self-pay | Admitting: Emergency Medicine

## 2014-08-20 ENCOUNTER — Emergency Department (HOSPITAL_COMMUNITY): Payer: BLUE CROSS/BLUE SHIELD

## 2014-08-20 DIAGNOSIS — E119 Type 2 diabetes mellitus without complications: Secondary | ICD-10-CM | POA: Diagnosis not present

## 2014-08-20 DIAGNOSIS — Z8742 Personal history of other diseases of the female genital tract: Secondary | ICD-10-CM | POA: Diagnosis not present

## 2014-08-20 DIAGNOSIS — Z8659 Personal history of other mental and behavioral disorders: Secondary | ICD-10-CM | POA: Diagnosis not present

## 2014-08-20 DIAGNOSIS — Z79899 Other long term (current) drug therapy: Secondary | ICD-10-CM | POA: Insufficient documentation

## 2014-08-20 DIAGNOSIS — R112 Nausea with vomiting, unspecified: Secondary | ICD-10-CM

## 2014-08-20 DIAGNOSIS — J45909 Unspecified asthma, uncomplicated: Secondary | ICD-10-CM | POA: Insufficient documentation

## 2014-08-20 DIAGNOSIS — Z872 Personal history of diseases of the skin and subcutaneous tissue: Secondary | ICD-10-CM | POA: Diagnosis not present

## 2014-08-20 DIAGNOSIS — Z8619 Personal history of other infectious and parasitic diseases: Secondary | ICD-10-CM | POA: Insufficient documentation

## 2014-08-20 DIAGNOSIS — Z3202 Encounter for pregnancy test, result negative: Secondary | ICD-10-CM | POA: Diagnosis not present

## 2014-08-20 DIAGNOSIS — Z87442 Personal history of urinary calculi: Secondary | ICD-10-CM | POA: Diagnosis not present

## 2014-08-20 DIAGNOSIS — R197 Diarrhea, unspecified: Secondary | ICD-10-CM | POA: Insufficient documentation

## 2014-08-20 DIAGNOSIS — R1032 Left lower quadrant pain: Secondary | ICD-10-CM | POA: Diagnosis present

## 2014-08-20 DIAGNOSIS — Z880 Allergy status to penicillin: Secondary | ICD-10-CM | POA: Diagnosis not present

## 2014-08-20 LAB — POC OCCULT BLOOD, ED: Fecal Occult Bld: NEGATIVE

## 2014-08-20 LAB — URINALYSIS, ROUTINE W REFLEX MICROSCOPIC
Bilirubin Urine: NEGATIVE
Glucose, UA: NEGATIVE mg/dL
LEUKOCYTES UA: NEGATIVE
Nitrite: NEGATIVE
PH: 5.5 (ref 5.0–8.0)
PROTEIN: NEGATIVE mg/dL
SPECIFIC GRAVITY, URINE: 1.02 (ref 1.005–1.030)
Urobilinogen, UA: 0.2 mg/dL (ref 0.0–1.0)

## 2014-08-20 LAB — COMPREHENSIVE METABOLIC PANEL
ALBUMIN: 4.3 g/dL (ref 3.5–5.2)
ALK PHOS: 72 U/L (ref 39–117)
ALT: 46 U/L — AB (ref 0–35)
AST: 37 U/L (ref 0–37)
Anion gap: 8 (ref 5–15)
BUN: 16 mg/dL (ref 6–23)
CALCIUM: 9.5 mg/dL (ref 8.4–10.5)
CO2: 24 mmol/L (ref 19–32)
CREATININE: 0.71 mg/dL (ref 0.50–1.10)
Chloride: 104 mEq/L (ref 96–112)
GFR calc non Af Amer: >90 mL/min (ref >90)
GLUCOSE: 149 mg/dL — AB (ref 70–99)
POTASSIUM: 4.3 mmol/L (ref 3.5–5.1)
SODIUM: 136 mmol/L (ref 135–145)
TOTAL PROTEIN: 7.9 g/dL (ref 6.0–8.3)
Total Bilirubin: 0.8 mg/dL (ref 0.3–1.2)

## 2014-08-20 LAB — CBC WITH DIFFERENTIAL/PLATELET
BASOS PCT: 0 % (ref 0–1)
Basophils Absolute: 0 10*3/uL (ref 0.0–0.1)
Eosinophils Absolute: 0.1 10*3/uL (ref 0.0–0.7)
Eosinophils Relative: 1 % (ref 0–5)
HCT: 38.8 % (ref 36.0–46.0)
Hemoglobin: 12.9 g/dL (ref 12.0–15.0)
LYMPHS ABS: 3 10*3/uL (ref 0.7–4.0)
Lymphocytes Relative: 37 % (ref 12–46)
MCH: 28.4 pg (ref 26.0–34.0)
MCHC: 33.2 g/dL (ref 30.0–36.0)
MCV: 85.3 fL (ref 78.0–100.0)
Monocytes Absolute: 0.5 10*3/uL (ref 0.1–1.0)
Monocytes Relative: 6 % (ref 3–12)
Neutro Abs: 4.6 10*3/uL (ref 1.7–7.7)
Neutrophils Relative %: 56 % (ref 43–77)
Platelets: 286 10*3/uL (ref 150–400)
RBC: 4.55 MIL/uL (ref 3.87–5.11)
RDW: 13.5 % (ref 11.5–15.5)
WBC: 8.2 10*3/uL (ref 4.0–10.5)

## 2014-08-20 LAB — URINE MICROSCOPIC-ADD ON

## 2014-08-20 LAB — LIPASE, BLOOD: Lipase: 34 U/L (ref 11–59)

## 2014-08-20 LAB — PREGNANCY, URINE: Preg Test, Ur: NEGATIVE

## 2014-08-20 MED ORDER — DICYCLOMINE HCL 20 MG PO TABS: 20.0000 mg | ORAL_TABLET | Freq: Four times a day (QID) | ORAL | Status: DC | PRN

## 2014-08-20 MED ORDER — DICYCLOMINE HCL 10 MG/ML IM SOLN
20.0000 mg | Freq: Once | INTRAMUSCULAR | Status: AC
  Administered 2014-08-20: 20 mg via INTRAMUSCULAR
  Filled 2014-08-20: qty 2

## 2014-08-20 MED ORDER — SODIUM CHLORIDE 0.9 % IV SOLN
INTRAVENOUS | Status: DC
  Administered 2014-08-20: 14:00:00 via INTRAVENOUS

## 2014-08-20 MED ORDER — ONDANSETRON HCL 4 MG/2ML IJ SOLN
4.0000 mg | INTRAMUSCULAR | Status: DC | PRN
  Administered 2014-08-20: 4 mg via INTRAVENOUS
  Filled 2014-08-20: qty 2

## 2014-08-20 MED ORDER — FENTANYL CITRATE 0.05 MG/ML IJ SOLN
50.0000 ug | INTRAMUSCULAR | Status: DC | PRN
  Administered 2014-08-20: 50 ug via INTRAVENOUS
  Filled 2014-08-20: qty 2

## 2014-08-20 MED ORDER — IOHEXOL 300 MG/ML  SOLN
100.0000 mL | Freq: Once | INTRAMUSCULAR | Status: AC | PRN
  Administered 2014-08-20: 100 mL via INTRAVENOUS

## 2014-08-20 MED ORDER — ONDANSETRON HCL 4 MG PO TABS: 4.0000 mg | ORAL_TABLET | Freq: Three times a day (TID) | ORAL | Status: DC | PRN

## 2014-08-20 MED ORDER — IOHEXOL 300 MG/ML  SOLN
50.0000 mL | Freq: Once | INTRAMUSCULAR | Status: AC | PRN
  Administered 2014-08-20: 50 mL via ORAL

## 2014-08-20 NOTE — Discharge Instructions (Signed)
°Emergency Department Resource Guide °1) Find a Doctor and Pay Out of Pocket °Although you won't have to find out who is covered by your insurance plan, it is a good idea to ask around and get recommendations. You will then need to call the office and see if the doctor you have chosen will accept you as a new patient and what types of options they offer for patients who are self-pay. Some doctors offer discounts or will set up payment plans for their patients who do not have insurance, but you will need to ask so you aren't surprised when you get to your appointment. ° °2) Contact Your Local Health Department °Not all health departments have doctors that can see patients for sick visits, but many do, so it is worth a call to see if yours does. If you don't know where your local health department is, you can check in your phone book. The CDC also has a tool to help you locate your state's health department, and many state websites also have listings of all of their local health departments. ° °3) Find a Walk-in Clinic °If your illness is not likely to be very severe or complicated, you may want to try a walk in clinic. These are popping up all over the country in pharmacies, drugstores, and shopping centers. They're usually staffed by nurse practitioners or physician assistants that have been trained to treat common illnesses and complaints. They're usually fairly quick and inexpensive. However, if you have serious medical issues or chronic medical problems, these are probably not your best option. ° °No Primary Care Doctor: °- Call [facility] at  832-8000 - they can help you locate a primary care doctor that  accepts your insurance, provides certain services, etc. °- Physician Referral Service- 1-800-533-3463 ° °Chronic Pain Problems: °Organization         Address  Phone   Notes  °[facility]  (336) 297-2271 Patients need to be referred by their primary care doctor.  ° °Medication  Assistance: °Organization         Address  Phone   Notes  °[place] [facility] [address] °[place], [place] [address] (336) 641-8030 --Must be a resident of [place] °-- Must have NO insurance coverage whatsoever (no Medicaid/ Medicare, etc.) °-- The pt. MUST have a primary care doctor that directs their care regularly and follows them in the community °  °[facility]  (866) 331-1348   °[employer]  (888) 892-1162   ° °Agencies that provide inexpensive medical care: °Organization         Address  Phone   Notes  °[facility]  (336) 832-8035   °[facility]    (336) 832-7272   °[facility] [address] °[place], [place] [address] (336) 832-4777   °[facility] [address], °[place] (336) 271-4999   °[facility]    (336) 373-0678   °[facility]    (336) 272-1050   °[facility] ° [address], [place] Phone:  (336) 832-4444, Fax:  (336) 832-4440 Hours of Operation:  9 am - 6 pm, M-F.  Also accepts Medicaid/Medicare and self-pay.  °[facility] ° [address], [place] Phone: (336) 832-3150, Fax: (336) 832-3151. Hours of Operation:  8:30 am - 5:30 pm, M-F.  Also accepts Medicaid and self-pay.  °[facility] [place] [address], [place] Phone: (336) 878-6027   °[facility] [address], [place], [place] (336)723-1848, Ext. 123 Mondays & Thursdays: 7-9 AM.  First 15 patients are seen on a first come, first serve basis. °  ° °Medicaid-accepting [place] Providers: ° °Organization         Address  Phone   Notes  °[facility] [address], [place] (336) 641-2100 Also accepts self-pay patients.  °[facility] [address], [place] ° (336) 856-9996   °[facility] [address], [place]  (336) 288-8857   °[facility] [address] [place] [address], [place] (336) 299-7000   °Veita Bland [address], [place]  ° (336) 373-1557 Only accepts [place] Access Medicaid patients after they have their name applied to their card.  ° °Self-Pay (no insurance) in [place]: ° °Organization         Address  Phone   Notes  °Sickle Cell Patients, [facility] [address], [place] (336) 832-1970   °[facility] [address], [place] (336) 832-4400   °[facility] ° [address], [place] (336) 992-4800   °[facility]/Dr. Osei-Bonsu ° [address] [place] [address], [place] or [address], [place] (336) 841-8500 Phone number for both [place] and [place] locations is the same.  °[facility] [address], [place] (336) 299-0000   °[facility] [address] [place] [address], [place] or [address] (336) 852-7530 °(336) 878-2260   °[facility] [address], [place] (336) 350-1642, phone; (336) 294-5005, fax Sees patients 1st and 3rd Saturday of every month.  Must not qualify for public or private insurance (i.e. Medicaid, Medicare, [place] Health Choice, Veterans' Benefits) • Household income should be no more than 200% of the poverty level •The clinic cannot treat you if you are pregnant or think you are pregnant • Sexually transmitted diseases are not treated at the clinic.  ° ° °Dental Care: °Organization         Address  Phone  Notes  °[place] [facility] [address], [place] (336) 641-6152 Accepts children up to age 21 who are enrolled in Medicaid or [place] Health Choice; pregnant women with a Medicaid card; and children who have applied for Medicaid or [place] Health Choice, but were declined, whose parents can pay a reduced fee at time of service.  °[place]  [facility] [place]  [address], [place] (336) 641-7733 Accepts children up to age 21 who are enrolled in Medicaid or [place] Health Choice; pregnant women with a Medicaid card; and children who have applied for Medicaid or [place] Health Choice, but were declined, whose parents can pay a reduced fee at time of service.  °[facility] ° [address], [place] (336) 641-4533 Patients are seen by appointment only. Walk-ins are not accepted. [facility] will see patients 18 years of age and older. °Monday - Tuesday (8am-5pm) °Most Wednesdays (8:30-5pm) °$30 per visit, cash only  °[facility] ° [address], [place] (336) 641-4533 Patients are seen by appointment only. Walk-ins are not accepted. [facility] will see patients 18 years of age and older. °One   Wednesday Evening (Monthly: Volunteer Based).  $30 per visit, cash only  °[facility]  (919) 537-3737 for adults; Children under age 4, call Graduate Pediatric Dentistry at ([phone]) [phone]. Children aged 4-14, please call ([phone]) [phone] to request a pediatric application. ° Dental services are provided in all areas of dental care including fillings, crowns and bridges, complete and partial dentures, implants, gum treatment, root canals, and extractions. Preventive care is also provided. Treatment is provided to both adults and children. °Patients are selected via a lottery and there is often a waiting list. °  °[facility] [address], °[place] ° (336) 763-8833 [email or web address] °  °[facility] [address], [place], [place] (336)723-1848, Ext. 123 Second and Fourth Thursday of each month, opens at 6:30 AM; Clinic ends at 9 AM.  Patients are seen on a first-come first-served basis, and a limited number are seen during each clinic.  ° °[facility] ° [address], [place], [place] (336) 723-7904    Eligibility Requirements °You must have lived in [place], [place], or [place] counties for at least the last three months. °  You cannot be eligible for state or federal sponsored healthcare insurance, including Veterans Administration, Medicaid, or Medicare. °  You generally cannot be eligible for healthcare insurance through your employer.  °  How to apply: °Eligibility screenings are held every Tuesday and Wednesday afternoon from 1:00 pm until 4:00 pm. You do not need an appointment for the interview!  °[facility] [address], [place], [place] 336-631-2330   °[facility]  336-342-8273   °[facility]  336-703-3100   °[facility]  336-570-6415   ° °Behavioral Health Resources in the Community: °Intensive Outpatient Programs °Organization         Address  Phone  Notes  °[facility] [address], [place], [place] 336-878-6098   °[facility] [address], [place], [place] 336-832-9800   °[facility] [address], [place], [place] ° 336-882-2125   °[facility] [address],  °[place], [place] 1-800-853-5163 or 336-641-4981   °Substance Abuse Resources °Organization         Address  Phone  Notes  °[facility]  336-882-2125   °[facility]  336-784-9470   °[facility]  336-285-9073   °[facility]  336-845-3988   °Residential & Outpatient Substance Abuse Program  1-800-659-3381   °Psychological Services °Organization         Address  Phone  Notes  °[facility]  336- 832-9600   °[facility]  336- 378-7881   °[facility] [address], [place] 1-800-853-5163 or 336-641-4981   ° °Mobile Crisis Teams °Organization         Address  Phone  Notes  °[facility], Mobile Crisis Care Unit  1-877-626-1772   °Assertive °[facility] ° [address]  [place], [place] 336-834-9664   °Sharon DeEsch [address] °[place] 336-554-5454   ° °Self-Help/Support Groups °Organization         Address  Phone             Notes  °[facility]  - variety of support groups  336- 373-1402 Call for more information  °Narcotics Anonymous (NA), [facility] [address], °[place]  2 meetings at this location  ° °  Residential Treatment Programs Organization         Address  Phone  Notes  [facility] [address],    [place]  [phone]   [facility]  [address], [place] [phone], [place], [place] [phone]   [facility] [address], [place] [phone] Admissions: 8am-3pm M-F  [facility] [address]    [place], [place] 295-284-1324([phone]   [facility] [address], [place], [place] [phone]   [facility] [address]  [place], [place] [phone]   [facility] Intensive Outpatient [address], [place], [place] [phone]) [phone]   [facility] ([facility] [address]  [place], [place] [phone] or [phone](603) [phone]   [facility] ([facility] [address] [place], [place] [phone] Accepts Medicaid  [facility] [address]  [place] [phone] Substance Abuse/Addiction Treatment   [facility]         Address  Phone  Notes  [facility]  435-666-7929(888) 650-207-5794   Angie FavaJulie Brannon, PhD [address] [place], [place]   [phone](336) ([phone] or ([phone](336) [phone]   [facility]   [address] [place], [place] [phone](336) [phone]   [facility] [address], [place], [place] ([phone](336) ([phone])[phone] Insurance/Medicaid/sponsorship through [facility] [address]                                    [place], [place] ([phone]) [phone](336) ([phone] Therapy/tele-psych/case    [facility] [address]   [place], [place] ([phone](336) [phone]    Dr. Lolly MustacheArfeen  [phone](336) [phone]   [facility]  [employer] [facility] 1) [address], [place] 2) [address], [place] 3)  [address], [place] (207) [phone](336) [phone](336) [phone]  [phone](336) ([phone]   [facility] Child Abuse Hotline [phone](336) 405-828-1792 or 701-031-1095(336) [phone] (After Hours)      Take the prescriptions as directed.  Increase your fluid intake (ie:  Gatoraide) for the next few days, as discussed.  Eat a bland diet and advance to your regular diet slowly as you can tolerate it.   Avoid full strength juices, as well as milk and milk products until your diarrhea has resolved.   Call your regular medical doctor tomorrow to schedule a follow up appointment in the next 2 to 3 days.  Return to the Emergency Department immediately if not improving (or even worsening) despite taking the medicines as prescribed, any black or bloody stool or vomit, if you develop a fever over "101," or for any other concerns.

## 2014-08-20 NOTE — ED Provider Notes (Signed)
CSN: [id number]     Arrival date & time 08/20/14  1257 History   First MD Initiated Contact with Patient 08/20/14 1314     Chief Complaint  Patient presents with  . Abdominal Pain      HPI Pt was seen at 1320.  Per pt, c/o gradual onset and persistence of constant left sided abd "pain" for the past 1 to 2 weeks. Has been associated with multiple intermittent episodes of N/V, as well as alternating "loose and then watery stools."  Describes the abd pain as "cramping" and "twisting." Denies fevers, no back pain, no rash, no CP/SOB, no black or blood in stools or emesis, no dysuria/hematuria, no vaginal bleeding/discharge.   Past Medical History  Diagnosis Date  . Kidney stones   . Herpes     anal  . Polycystic ovarian disease   . Diabetes mellitus without complication   . Asthma   . Headache(784.0)   . Hx of chlamydia infection   . History of PCOS   . Depression 11/25/2012  . Dyslipidemia 12/31/2012  . Vaginal itching 03/02/2014  . Yeast infection 03/02/2014   Past Surgical History  Procedure Laterality Date  . Inner ear surgery    . Arm hardware removal     Family History  Problem Relation Age of Onset  . Hypertension Mother   . Diabetes Mother   . Depression Mother   . Mental illness Mother   . Diabetes Father   . Heart attack Maternal Grandmother   . Heart disease Maternal Grandmother   . Diabetes Maternal Grandmother   . Diabetes Maternal Grandfather   . Diabetes Paternal Grandmother   . Diabetes Paternal Grandfather    History  Substance Use Topics  . Smoking status: Never Smoker   . Smokeless tobacco: Never Used  . Alcohol Use: Yes     Comment: occ    Review of Systems ROS: Statement: All systems negative except as marked or noted in the HPI; Constitutional: Negative for fever and chills. ; ; Eyes: Negative for eye pain, redness and discharge. ; ; ENMT: Negative for ear pain, hoarseness, nasal congestion, sinus pressure and sore throat. ; ; Cardiovascular:  Negative for chest pain, palpitations, diaphoresis, dyspnea and peripheral edema. ; ; Respiratory: Negative for cough, wheezing and stridor. ; ; Gastrointestinal: +N/V/D, abd pain. Negative for blood in stool, hematemesis, jaundice and rectal bleeding. . ; ; Genitourinary: Negative for dysuria, flank pain and hematuria. ; ; Musculoskeletal: Negative for back pain and neck pain. Negative for swelling and trauma.; ; Skin: Negative for pruritus, rash, abrasions, blisters, bruising and skin lesion.; ; Neuro: Negative for headache, lightheadedness and neck stiffness. Negative for weakness, altered level of consciousness , altered mental status, extremity weakness, paresthesias, involuntary movement, seizure and syncope.      Allergies  Penicillins; Bee venom; and Hydrocodone-acetaminophen  Home Medications   Prior to Admission medications   Medication Sig Start Date End Date Taking? Authorizing Provider  acetaminophen (TYLENOL) 500 MG tablet Take 2,000 mg by mouth once as needed (severe pain).   Yes Historical Provider, MD  Canagliflozin (INVOKANA) 300 MG TABS Take 300 mg by mouth daily before lunch.     Historical Provider, MD  clindamycin (CLEOCIN) 300 MG capsule Take 1 capsule (300 mg total) by mouth 4 (four) times daily. Patient not taking: Reported on 08/20/2014 04/11/14   Ward Givens, MD  fluconazole (DIFLUCAN) 150 MG tablet 1 po stat; repeat in 3 days Patient not taking: Reported on 08/20/2014  05/13/14   Jacklyn Shell, CNM  HYDROcodone-acetaminophen (NORCO/VICODIN) 5-325 MG per tablet Take 1 tablet by mouth every 6 (six) hours as needed. Patient not taking: Reported on 08/20/2014 04/28/14   Benny Lennert, MD  ondansetron (ZOFRAN ODT) 4 MG disintegrating tablet  ODT q4 hours prn nausea/vomit Patient not taking: Reported on 08/20/2014 04/28/14   Benny Lennert, MD  ondansetron (ZOFRAN ODT) 4 MG disintegrating tablet  ODT q4 hours prn nausea/vomit Patient not taking: Reported on  08/20/2014 04/28/14   Benny Lennert, MD  oxyCODONE-acetaminophen (PERCOCET/ROXICET) 5-325 MG per tablet Take 1 tablet by mouth every 6 (six) hours as needed for severe pain. Patient not taking: Reported on 08/20/2014 04/11/14   Ward Givens, MD  oxyCODONE-acetaminophen (PERCOCET/ROXICET) 5-325 MG per tablet Take 1 tablet by mouth every 6 (six) hours as needed. Patient not taking: Reported on 08/20/2014 04/28/14   Benny Lennert, MD   BP 119/82 mmHg  Pulse 89  Temp(Src) 97.9 F (36.6 C) (Oral)  Resp 20  Ht  (1.854 m)  Wt 309 lb (140.161 kg)  BMI 40.78 kg/m2  SpO2 100%  LMP 06/10/2014 Physical Exam  1325: Physical examination:  Nursing notes reviewed; Vital signs and O2 SAT reviewed;  Constitutional: Well developed, Well nourished, Well hydrated, In no acute distress; Head:  Normocephalic, atraumatic; Eyes: EOMI, PERRL, No scleral icterus; ENMT: Mouth and pharynx normal, Mucous membranes moist; Neck: Supple, Full range of motion, No lymphadenopathy; Cardiovascular: Regular rate and rhythm, No murmur, rub, or gallop; Respiratory: Breath sounds clear & equal bilaterally, No rales, rhonchi, wheezes.  Speaking full sentences with ease, Normal respiratory effort/excursion; Chest: Nontender, Movement normal; Abdomen: Soft, +LUQ and LLQ tender to palp. No rebound or guarding. Nondistended, Normal bowel sounds; Genitourinary: No CVA tenderness; Extremities: Pulses normal, No tenderness, No edema, No calf edema or asymmetry.; Neuro: AA&Ox3, Major CN grossly intact.  Speech clear. No gross focal motor or sensory deficits in extremities.; Skin: Color normal, Warm, Dry.   ED Course  Procedures     EKG Interpretation None      MDM  MDM Reviewed: previous chart, nursing note and vitals Reviewed previous: labs Interpretation: labs and CT scan     Results for orders placed or performed during the hospital encounter of 08/20/14  Urinalysis, Routine w reflex microscopic  Result Value Ref Range    Color, Urine YELLOW YELLOW   APPearance CLEAR CLEAR   Specific Gravity, Urine 1.020 1.005 - 1.030   pH 5.5 5.0 - 8.0   Glucose, UA NEGATIVE NEGATIVE mg/dL   Hgb urine dipstick TRACE (A) NEGATIVE   Bilirubin Urine NEGATIVE NEGATIVE   Ketones, ur TRACE (A) NEGATIVE mg/dL   Protein, ur NEGATIVE NEGATIVE mg/dL   Urobilinogen, UA 0.2 0.0 - 1.0 mg/dL   Nitrite NEGATIVE NEGATIVE   Leukocytes, UA NEGATIVE NEGATIVE  Pregnancy, urine  Result Value Ref Range   Preg Test, Ur NEGATIVE NEGATIVE  Comprehensive metabolic panel  Result Value Ref Range   Sodium 136 135 - 145 mmol/L   Potassium 4.3 3.5 - 5.1 mmol/L   Chloride 104 96 - 112 mEq/L   CO2 24 19 - 32 mmol/L   Glucose, Bld 149 (H) 70 - 99 mg/dL   BUN 16 6 - 23 mg/dL   Creatinine, Ser 1.32 0.50 - 1.10 mg/dL   Calcium 9.5 8.4 - 44.0 mg/dL   Total Protein 7.9 6.0 - 8.3 g/dL   Albumin 4.3 3.5 - 5.2 g/dL   AST 37 0 -  37 U/L   ALT 46 (H) 0 - 35 U/L   Alkaline Phosphatase 72 39 - 117 U/L   Total Bilirubin 0.8 0.3 - 1.2 mg/dL   GFR calc non Af Amer >90 >90 mL/min   GFR calc Af Amer >90 >90 mL/min   Anion gap 8 5 - 15  Lipase, blood  Result Value Ref Range   Lipase 34 11 - 59 U/L  CBC with Differential  Result Value Ref Range   WBC 8.2 4.0 - 10.5 K/uL   RBC 4.55 3.87 - 5.11 MIL/uL   Hemoglobin 12.9 12.0 - 15.0 g/dL   HCT 81.138.8 91.436.0 - 78.246.0 %   MCV 85.3 78.0 - 100.0 fL   MCH 28.4 26.0 - 34.0 pg   MCHC 33.2 30.0 - 36.0 g/dL   RDW 95.613.5 21.311.5 - 08.615.5 %   Platelets 286 150 - 400 K/uL   Neutrophils Relative % 56 43 - 77 %   Neutro Abs 4.6 1.7 - 7.7 K/uL   Lymphocytes Relative 37 12 - 46 %   Lymphs Abs 3.0 0.7 - 4.0 K/uL   Monocytes Relative 6 3 - 12 %   Monocytes Absolute 0.5 0.1 - 1.0 K/uL   Eosinophils Relative 1 0 - 5 %   Eosinophils Absolute 0.1 0.0 - 0.7 K/uL   Basophils Relative 0 0 - 1 %   Basophils Absolute 0.0 0.0 - 0.1 K/uL  Urine microscopic-add on  Result Value Ref Range   Squamous Epithelial / LPF MANY (A) RARE   WBC, UA  0-2 <3 WBC/hpf   RBC / HPF 0-2 <3 RBC/hpf   Bacteria, UA MANY (A) RARE  POC occult blood, ED  Result Value Ref Range   Fecal Occult Bld NEGATIVE NEGATIVE   Ct Abdomen Pelvis W Contrast 08/20/2014   CLINICAL DATA:  Left lower quadrant abdominal pain and diarrhea for 1-1/2 weeks. Vomiting this morning.  EXAM: CT ABDOMEN AND PELVIS WITH CONTRAST  TECHNIQUE: Multidetector CT imaging of the abdomen and pelvis was performed using the standard protocol following bolus administration of intravenous contrast.  CONTRAST:  100mL OMNIPAQUE IOHEXOL 300 MG/ML SOLN, 50mL OMNIPAQUE IOHEXOL 300 MG/ML SOLN  COMPARISON:  CT 06/05/2013  FINDINGS: The included lung bases are clear, minimal subsegmental atelectasis in the left lower lobe.  There is diffuse decreased density of the liver parenchyma consistent with hepatic steatosis. No evidence of focal lesion. The gallbladder is minimally distended. No calcified stone. There is no biliary dilatation. The spleen, pancreas, and adrenal glands appear normal. Punctate nonobstructing stones in the mid in the lower left kidney. There is a punctate nonobstructing stone in the lower right kidney. No hydronephrosis or perinephric stranding.  There are no dilated or thickened bowel loops. The sigmoid colon is redundant. The appendix is normal. No free air, free fluid, or intra-abdominal fluid collection. The abdominal aorta is normal in caliber. There is no retroperitoneal adenopathy. There is a retro aortic left renal vein. Incidental note of accessory right renal arteries.  Urinary bladder is physiologically distended. The uterus and adnexa are normal for age. There is no pelvic free fluid.  No acute or suspicious osseous abnormalities. Template irregularity superior L3, stable from prior.  IMPRESSION: 1. No acute abnormality in the abdomen/pelvis. No signs of bowel inflammation. 2. Stable chronic findings include nonobstructing nephrolithiasis and hepatic steatosis.   Electronically  Signed   By: Rubye OaksMelanie  Ehinger M.D.   On: 08/20/2014 17:17    1730: Pt has tol PO well while  in the ED without N/V.  No stooling while in the ED.  Abd benign, VSS. Feels better and wants to go home now. Dx and testing d/w pt.  Questions answered.  Verb understanding, agreeable to d/c home with outpt f/u.    Samuel Jester, DO 08/24/14 0236

## 2014-08-20 NOTE — ED Notes (Signed)
Pt was able to void on her own, did not do in and out cath

## 2014-08-20 NOTE — ED Notes (Signed)
Pt unable to void at this time. 

## 2014-08-20 NOTE — ED Notes (Signed)
Pt reports LLQ abdominal pain and diarrhea x1.5 weeks. Pt also reports vomiting starting this am.

## 2014-09-11 ENCOUNTER — Emergency Department (HOSPITAL_COMMUNITY)
Admission: EM | Admit: 2014-09-11 | Discharge: 2014-09-11 | Disposition: A | Discharge status: Home / Self Care | Payer: BLUE CROSS/BLUE SHIELD | Attending: Emergency Medicine | Admitting: Emergency Medicine

## 2014-09-11 ENCOUNTER — Encounter (HOSPITAL_COMMUNITY): Payer: Self-pay | Admitting: Emergency Medicine

## 2014-09-11 ENCOUNTER — Emergency Department (HOSPITAL_COMMUNITY): Payer: BLUE CROSS/BLUE SHIELD

## 2014-09-11 DIAGNOSIS — J45909 Unspecified asthma, uncomplicated: Secondary | ICD-10-CM | POA: Insufficient documentation

## 2014-09-11 DIAGNOSIS — Z792 Long term (current) use of antibiotics: Secondary | ICD-10-CM | POA: Diagnosis not present

## 2014-09-11 DIAGNOSIS — Z880 Allergy status to penicillin: Secondary | ICD-10-CM | POA: Diagnosis not present

## 2014-09-11 DIAGNOSIS — N390 Urinary tract infection, site not specified: Secondary | ICD-10-CM | POA: Insufficient documentation

## 2014-09-11 DIAGNOSIS — R1030 Lower abdominal pain, unspecified: Secondary | ICD-10-CM | POA: Diagnosis present

## 2014-09-11 DIAGNOSIS — Z791 Long term (current) use of non-steroidal anti-inflammatories (NSAID): Secondary | ICD-10-CM | POA: Diagnosis not present

## 2014-09-11 DIAGNOSIS — Z79899 Other long term (current) drug therapy: Secondary | ICD-10-CM | POA: Insufficient documentation

## 2014-09-11 DIAGNOSIS — Z3202 Encounter for pregnancy test, result negative: Secondary | ICD-10-CM | POA: Diagnosis not present

## 2014-09-11 DIAGNOSIS — R109 Unspecified abdominal pain: Secondary | ICD-10-CM

## 2014-09-11 DIAGNOSIS — E119 Type 2 diabetes mellitus without complications: Secondary | ICD-10-CM | POA: Diagnosis not present

## 2014-09-11 DIAGNOSIS — F329 Major depressive disorder, single episode, unspecified: Secondary | ICD-10-CM | POA: Insufficient documentation

## 2014-09-11 DIAGNOSIS — Z87448 Personal history of other diseases of urinary system: Secondary | ICD-10-CM | POA: Insufficient documentation

## 2014-09-11 DIAGNOSIS — Z87440 Personal history of urinary (tract) infections: Secondary | ICD-10-CM | POA: Insufficient documentation

## 2014-09-11 DIAGNOSIS — Z8619 Personal history of other infectious and parasitic diseases: Secondary | ICD-10-CM | POA: Diagnosis not present

## 2014-09-11 LAB — URINE MICROSCOPIC-ADD ON

## 2014-09-11 LAB — URINALYSIS, ROUTINE W REFLEX MICROSCOPIC
BILIRUBIN URINE: NEGATIVE
Glucose, UA: 100 mg/dL — AB
Ketones, ur: NEGATIVE mg/dL
NITRITE: POSITIVE — AB
PH: 5.5 (ref 5.0–8.0)
Protein, ur: NEGATIVE mg/dL
Specific Gravity, Urine: 1.025 (ref 1.005–1.030)
UROBILINOGEN UA: 0.2 mg/dL (ref 0.0–1.0)

## 2014-09-11 LAB — PREGNANCY, URINE: Preg Test, Ur: NEGATIVE

## 2014-09-11 MED ORDER — ONDANSETRON 4 MG PO TBDP: 4.0000 mg | ORAL_TABLET | Freq: Three times a day (TID) | ORAL | Status: DC | PRN

## 2014-09-11 MED ORDER — CIPROFLOXACIN HCL 500 MG PO TABS: 500.0000 mg | ORAL_TABLET | Freq: Two times a day (BID) | ORAL | Status: DC

## 2014-09-11 MED ORDER — CIPROFLOXACIN HCL 250 MG PO TABS
500.0000 mg | ORAL_TABLET | Freq: Once | ORAL | Status: AC
  Administered 2014-09-11: 500 mg via ORAL
  Filled 2014-09-11: qty 2

## 2014-09-11 MED ORDER — NAPROXEN 500 MG PO TABS: 500.0000 mg | ORAL_TABLET | Freq: Two times a day (BID) | ORAL | Status: DC

## 2014-09-11 NOTE — ED Notes (Signed)
Left flank pain that started Wed. Pt states a cloudy urine & burning after urination.

## 2014-09-11 NOTE — Discharge Instructions (Signed)
CT scan was negative. Take the antibiotic as directed for the urinary tract infection. Return for development of fevers worse pain persistent vomiting. Also take the Naprosyn as needed for discomfort and take the Zofran as needed for nausea. You should be improving in 2 days if not she needs to get seen.

## 2014-09-11 NOTE — ED Provider Notes (Signed)
CSN: [id number]     Arrival date & time 09/11/14  0251 History  This chart was scribed for Rachael Mulders, MD by Luisa Dago, ED Scribe. This patient was seen in room APA06/[facility]06 and the patient's care was started at 7:41 AM.    Chief Complaint  Patient presents with  . Flank Pain   The history is provided by the patient and medical records. No language interpreter was used.   HPI Comments: Rachael Collier is a 29 y.o. female with a prior h/o kidney stones presents to the Emergency Department complaining of sudden onset left sided flank pain that started 2 days ago. Pt states that that the pain wraps around to her stomach. She does endorse pain with urination which she describes as "stabbing" in nature. She admits to increased urinary frequency with some bladder incontinence. Denies any fever, chills, visual disturbances, sinus pressure, sore throat, emesis, SOB, cough, CP, HA, or confusion.  Past Medical History  Diagnosis Date  . Kidney stones   . Herpes     anal  . Polycystic ovarian disease   . Diabetes mellitus without complication   . Asthma   . Headache(784.0)   . Hx of chlamydia infection   . History of PCOS   . Depression 11/25/2012  . Dyslipidemia 12/31/2012  . Vaginal itching 03/02/2014  . Yeast infection 03/02/2014   Past Surgical History  Procedure Laterality Date  . Inner ear surgery    . Arm hardware removal     Family History  Problem Relation Age of Onset  . Hypertension Mother   . Diabetes Mother   . Depression Mother   . Mental illness Mother   . Diabetes Father   . Heart attack Maternal Grandmother   . Heart disease Maternal Grandmother   . Diabetes Maternal Grandmother   . Diabetes Maternal Grandfather   . Diabetes Paternal Grandmother   . Diabetes Paternal Grandfather    History  Substance Use Topics  . Smoking status: Never Smoker   . Smokeless tobacco: Never Used  . Alcohol Use: Yes     Comment: occ   OB History    No data available      Review of Systems  Constitutional: Negative for fever and chills.  HENT: Negative for congestion, sinus pressure and sore throat.   Eyes: Negative for visual disturbance.  Respiratory: Negative for cough and shortness of breath.   Gastrointestinal: Positive for nausea and abdominal pain. Negative for vomiting and diarrhea.  Genitourinary: Positive for dysuria, frequency and flank pain.  Musculoskeletal: Negative for back pain and arthralgias.  Skin: Negative for rash.  Neurological: Negative for headaches.  Psychiatric/Behavioral: Negative for confusion.      Allergies  Penicillins; Bee venom; and Hydrocodone-acetaminophen  Home Medications   Prior to Admission medications   Medication Sig Start Date End Date Taking? Authorizing Provider  acetaminophen (TYLENOL) 500 MG tablet Take 2,000 mg by mouth once as needed (severe pain).   Yes Historical Provider, MD  ciprofloxacin (CIPRO) 500 MG tablet Take 1 tablet (500 mg total) by mouth 2 (two) times daily. 09/11/14   Rachael Mulders, MD  clindamycin (CLEOCIN) 300 MG capsule Take 1 capsule (300 mg total) by mouth 4 (four) times daily. Patient not taking: Reported on 08/20/2014 04/11/14   Ward Givens, MD  dicyclomine (BENTYL) 20 MG tablet Take 1 tablet (20 mg total) by mouth every 6 (six) hours as needed for spasms (abdominal cramping). Patient not taking: Reported on 09/11/2014 08/20/14  Samuel JesterKathleen McManus, DO  fluconazole (DIFLUCAN) 150 MG tablet 1 po stat; repeat in 3 days Patient not taking: Reported on 08/20/2014 05/13/14   Jacklyn ShellFrances Cresenzo-Dishmon, CNM  HYDROcodone-acetaminophen (NORCO/VICODIN) 5-325 MG per tablet Take 1 tablet by mouth every 6 (six) hours as needed. Patient not taking: Reported on 08/20/2014 04/28/14   Benny LennertJoseph L Zammit, MD  naproxen (NAPROSYN) 500 MG tablet Take 1 tablet (500 mg total) by mouth 2 (two) times daily. 09/11/14   Rachael MuldersScott Hedwig Mcfall, MD  ondansetron (ZOFRAN ODT) 4 MG disintegrating tablet 4mg  ODT q4 hours prn  nausea/vomit Patient not taking: Reported on 08/20/2014 04/28/14   Benny LennertJoseph L Zammit, MD  ondansetron (ZOFRAN ODT) 4 MG disintegrating tablet 4mg  ODT q4 hours prn nausea/vomit Patient not taking: Reported on 08/20/2014 04/28/14   Benny LennertJoseph L Zammit, MD  ondansetron (ZOFRAN ODT) 4 MG disintegrating tablet Take 1 tablet (4 mg total) by mouth every 8 (eight) hours as needed. 09/11/14   Rachael MuldersScott Jorja Empie, MD  ondansetron (ZOFRAN) 4 MG tablet Take 1 tablet (4 mg total) by mouth every 8 (eight) hours as needed for nausea or vomiting. Patient not taking: Reported on 09/11/2014 08/20/14   Samuel JesterKathleen McManus, DO  oxyCODONE-acetaminophen (PERCOCET/ROXICET) 5-325 MG per tablet Take 1 tablet by mouth every 6 (six) hours as needed for severe pain. Patient not taking: Reported on 08/20/2014 04/11/14   Ward GivensIva L Knapp, MD  oxyCODONE-acetaminophen (PERCOCET/ROXICET) 5-325 MG per tablet Take 1 tablet by mouth every 6 (six) hours as needed. Patient not taking: Reported on 08/20/2014 04/28/14   Benny LennertJoseph L Zammit, MD   BP 104/73 mmHg  Pulse 78  Temp(Src) 97.5 F (36.4 C) (Oral)  Resp 20  Ht 6\' 1"  (1.854 m)  Wt 315 lb (142.883 kg)  BMI 41.57 kg/m2  SpO2 97%  LMP 08/31/2014  Physical Exam  Constitutional: She is oriented to person, place, and time. She appears well-developed and well-nourished. No distress.  HENT:  Head: Normocephalic and atraumatic.  Eyes: Conjunctivae and EOM are normal. Pupils are equal, round, and reactive to light. No scleral icterus.  Neck: Neck supple.  Cardiovascular: Normal rate, regular rhythm, normal heart sounds and intact distal pulses.  Exam reveals no gallop and no friction rub.   No murmur heard. Pulmonary/Chest: Effort normal and breath sounds normal. No respiratory distress.  Abdominal: Soft. Bowel sounds are normal. She exhibits no distension. There is no tenderness.  Musculoskeletal: Normal range of motion.  No swelling in bilateral lower extremities.  Neurological: She is alert and oriented to  person, place, and time.  Skin: Skin is warm and dry.  Psychiatric: She has a normal mood and affect. Her behavior is normal.  Nursing note and vitals reviewed.   ED Course  Procedures (including critical care time)  DIAGNOSTIC STUDIES: Oxygen Saturation is 97% on RA, normal by my interpretation.    COORDINATION OF CARE: 7:47 AM- Pt advised of plan for treatment and pt agrees.  Labs Review Labs Reviewed  URINALYSIS, ROUTINE W REFLEX MICROSCOPIC - Abnormal; Notable for the following:    APPearance HAZY (*)    Glucose, UA 100 (*)    Hgb urine dipstick TRACE (*)    Nitrite POSITIVE (*)    Leukocytes, UA SMALL (*)    All other components within normal limits  URINE MICROSCOPIC-ADD ON - Abnormal; Notable for the following:    Squamous Epithelial / LPF MANY (*)    Bacteria, UA MANY (*)    All other components within normal limits  URINE CULTURE  PREGNANCY,  URINE   Results for orders placed or performed during the hospital encounter of 09/11/14  Urinalysis, Routine w reflex microscopic  Result Value Ref Range   Color, Urine YELLOW YELLOW   APPearance HAZY (A) CLEAR   Specific Gravity, Urine 1.025 1.005 - 1.030   pH 5.5 5.0 - 8.0   Glucose, UA 100 (A) NEGATIVE mg/dL   Hgb urine dipstick TRACE (A) NEGATIVE   Bilirubin Urine NEGATIVE NEGATIVE   Ketones, ur NEGATIVE NEGATIVE mg/dL   Protein, ur NEGATIVE NEGATIVE mg/dL   Urobilinogen, UA 0.2 0.0 - 1.0 mg/dL   Nitrite POSITIVE (A) NEGATIVE   Leukocytes, UA SMALL (A) NEGATIVE  Pregnancy, urine  Result Value Ref Range   Preg Test, Ur NEGATIVE NEGATIVE  Urine microscopic-add on  Result Value Ref Range   Squamous Epithelial / LPF MANY (A) RARE   WBC, UA 21-50 <3 WBC/hpf   RBC / HPF 0-2 <3 RBC/hpf   Bacteria, UA MANY (A) RARE    Imaging Review Ct Renal Stone Study  09/11/2014   CLINICAL DATA:  Left flank pain for 3 days with micro hematuria. History of kidney stones. Initial encounter.  EXAM: CT ABDOMEN AND PELVIS WITHOUT  CONTRAST  TECHNIQUE: Multidetector CT imaging of the abdomen and pelvis was performed following the standard protocol without IV contrast.  COMPARISON:  08/20/2014 and 06/05/2013 CTs.  FINDINGS: Lower chest: Clear lung bases. No significant pleural or pericardial effusion.  Hepatobiliary: Diffuse hepatic steatosis again noted with areas of relative sparing around the gallbladder and porta hepatis. No focal lesions identified. No evidence of gallstones, gallbladder wall thickening or biliary dilatation.  Pancreas: Unremarkable. No pancreatic ductal dilatation or surrounding inflammatory changes.  Spleen: Normal in size without focal abnormality.  Adrenals/Urinary Tract: Both adrenal glands appear normal.Bilateral nonobstructing renal calculi are again demonstrated without significant change. The largest calculus measures 3 mm in the interpolar region of the left kidney (image 40). There is no hydronephrosis, ureteral or bladder calculus. As evaluated in the noncontrast state, no focal renal or bladder abnormality is demonstrated.  Stomach/Bowel: No evidence of bowel wall thickening, distention or surrounding inflammatory change.The appendix appears normal. There is no extraluminal fluid collection.  Vascular/Lymphatic: There are no enlarged abdominal or pelvic lymph nodes. No significant vascular findings are present.  Reproductive: The uterus and ovaries appear normal. There is no evidence of adnexal mass.  Other: Stable small umbilical hernia containing only fat.  Musculoskeletal: No acute or significant osseous findings. Stable limbus vertebra involving the superior endplate of L3.  IMPRESSION: 1. No acute findings or explanation for the patient's symptoms. 2. Stable bilateral nonobstructing renal calculi. No evidence of hydronephrosis or ureteral calculus. 3. Hepatic steatosis.   Electronically Signed   By: Roxy Horseman M.D.   On: 09/11/2014 08:37     EKG Interpretation None      MDM   Final diagnoses:   Flank pain  UTI (lower urinary tract infection)    Urine culture is pending. Urinalysis consistent with urinary tract infection. CT scan negative for any ureteral stones. Patient symptoms could be early pyelonephritis but not toxic no acute distress. Will treat with Cipro. First dose provided here. Patient started on Cipro because she has penicillin allergy. Precautions provided. Pregnancy test negative.   I personally performed the services described in this documentation, which was scribed in my presence. The recorded information has been reviewed and is accurate.      Rachael Mulders, MD 09/11/14 1013

## 2014-09-11 NOTE — ED Notes (Signed)
Patient c/o left flank pain since Wednesday.

## 2014-09-11 NOTE — ED Notes (Signed)
Patient given discharge instruction, verbalized understand. IV removed, band aid applied. Patient ambulatory out of the department.  

## 2014-09-14 ENCOUNTER — Emergency Department (HOSPITAL_COMMUNITY)
Admission: EM | Admit: 2014-09-14 | Discharge: 2014-09-14 | Disposition: A | Discharge status: Home / Self Care | Payer: BLUE CROSS/BLUE SHIELD | Attending: Emergency Medicine | Admitting: Emergency Medicine

## 2014-09-14 ENCOUNTER — Encounter (HOSPITAL_COMMUNITY): Payer: Self-pay | Admitting: Emergency Medicine

## 2014-09-14 DIAGNOSIS — F329 Major depressive disorder, single episode, unspecified: Secondary | ICD-10-CM | POA: Diagnosis not present

## 2014-09-14 DIAGNOSIS — Z792 Long term (current) use of antibiotics: Secondary | ICD-10-CM | POA: Insufficient documentation

## 2014-09-14 DIAGNOSIS — Z87442 Personal history of urinary calculi: Secondary | ICD-10-CM | POA: Diagnosis not present

## 2014-09-14 DIAGNOSIS — N390 Urinary tract infection, site not specified: Secondary | ICD-10-CM

## 2014-09-14 DIAGNOSIS — Z872 Personal history of diseases of the skin and subcutaneous tissue: Secondary | ICD-10-CM | POA: Insufficient documentation

## 2014-09-14 DIAGNOSIS — E785 Hyperlipidemia, unspecified: Secondary | ICD-10-CM | POA: Insufficient documentation

## 2014-09-14 DIAGNOSIS — Z8619 Personal history of other infectious and parasitic diseases: Secondary | ICD-10-CM | POA: Insufficient documentation

## 2014-09-14 DIAGNOSIS — Z79899 Other long term (current) drug therapy: Secondary | ICD-10-CM | POA: Insufficient documentation

## 2014-09-14 DIAGNOSIS — E119 Type 2 diabetes mellitus without complications: Secondary | ICD-10-CM | POA: Insufficient documentation

## 2014-09-14 DIAGNOSIS — J45909 Unspecified asthma, uncomplicated: Secondary | ICD-10-CM | POA: Insufficient documentation

## 2014-09-14 DIAGNOSIS — Z791 Long term (current) use of non-steroidal anti-inflammatories (NSAID): Secondary | ICD-10-CM | POA: Diagnosis not present

## 2014-09-14 DIAGNOSIS — Z3202 Encounter for pregnancy test, result negative: Secondary | ICD-10-CM | POA: Diagnosis not present

## 2014-09-14 DIAGNOSIS — R1030 Lower abdominal pain, unspecified: Secondary | ICD-10-CM | POA: Diagnosis present

## 2014-09-14 LAB — URINE CULTURE: Colony Count: >=100000

## 2014-09-14 LAB — URINALYSIS W MICROSCOPIC (NOT AT ARMC)
Bilirubin Urine: NEGATIVE
Glucose, UA: 100 mg/dL — AB
Hgb urine dipstick: NEGATIVE
Ketones, ur: NEGATIVE mg/dL
Nitrite: NEGATIVE
Protein, ur: NEGATIVE mg/dL
Specific Gravity, Urine: 1.017 (ref 1.005–1.030)
Urobilinogen, UA: 0.2 mg/dL (ref 0.0–1.0)
pH: 5.5 (ref 5.0–8.0)

## 2014-09-14 LAB — BASIC METABOLIC PANEL WITH GFR
Anion gap: 8 (ref 5–15)
BUN: 13 mg/dL (ref 6–23)
CO2: 24 mmol/L (ref 19–32)
Calcium: 9.3 mg/dL (ref 8.4–10.5)
Chloride: 103 mmol/L (ref 96–112)
Creatinine, Ser: 0.78 mg/dL (ref 0.50–1.10)
GFR calc Af Amer: >90 mL/min
GFR calc non Af Amer: >90 mL/min
Glucose, Bld: 220 mg/dL — ABNORMAL HIGH (ref 70–99)
Potassium: 3.6 mmol/L (ref 3.5–5.1)
Sodium: 135 mmol/L (ref 135–145)

## 2014-09-14 LAB — CBC
HCT: 39.9 % (ref 36.0–46.0)
Hemoglobin: 13.8 g/dL (ref 12.0–15.0)
MCH: 29.2 pg (ref 26.0–34.0)
MCHC: 34.6 g/dL (ref 30.0–36.0)
MCV: 84.4 fL (ref 78.0–100.0)
PLATELETS: 299 10*3/uL (ref 150–400)
RBC: 4.73 MIL/uL (ref 3.87–5.11)
RDW: 13.5 % (ref 11.5–15.5)
WBC: 9.2 10*3/uL (ref 4.0–10.5)

## 2014-09-14 LAB — PREGNANCY, URINE: Preg Test, Ur: NEGATIVE

## 2014-09-14 MED ORDER — PHENAZOPYRIDINE HCL 200 MG PO TABS: 200.0000 mg | ORAL_TABLET | Freq: Three times a day (TID) | ORAL | Status: DC

## 2014-09-14 MED ORDER — KETOROLAC TROMETHAMINE 30 MG/ML IJ SOLN
30.0000 mg | Freq: Once | INTRAMUSCULAR | Status: AC
  Administered 2014-09-14: 30 mg via INTRAVENOUS
  Filled 2014-09-14: qty 1

## 2014-09-14 MED ORDER — SULFAMETHOXAZOLE-TRIMETHOPRIM 800-160 MG PO TABS: 1.0000 | ORAL_TABLET | Freq: Two times a day (BID) | ORAL | Status: DC

## 2014-09-14 NOTE — Discharge Instructions (Signed)
Return to the ED with any concerns including vomiting and not able to keep down liquids or antibiotics, fever/chills, worsening pain, decreased level of alertness/lethargy, or any other alarming symptoms

## 2014-09-14 NOTE — ED Provider Notes (Signed)
CSN: [id number]     Arrival date & time 09/14/14  0434 History   First MD Initiated Contact with Patient 09/14/14 0451     Chief Complaint  Patient presents with  . Flank Pain     (Consider location/radiation/quality/duration/timing/severity/associated sxs/prior Treatment) HPI  Pt presenting with left lower back pain.  Pt states that she has had ongoing pain over the past 5 days.  [facility]- had reassuring ct scan showing no ureteral stones.  Urine did show UTI so patient was started on cipro.  She states she has had no decrease in her pain.  She feels pain in left flank after urination.  No fever/chills.  No vomiting.  No changes in stools.  There are no other associated systemic symptoms, there are no other alleviating or modifying factors.   Past Medical History  Diagnosis Date  . Kidney stones   . Herpes     anal  . Polycystic ovarian disease   . Diabetes mellitus without complication   . Asthma   . Headache(784.0)   . Hx of chlamydia infection   . History of PCOS   . Depression 11/25/2012  . Dyslipidemia 12/31/2012  . Vaginal itching 03/02/2014  . Yeast infection 03/02/2014   Past Surgical History  Procedure Laterality Date  . Inner ear surgery    . Arm hardware removal     Family History  Problem Relation Age of Onset  . Hypertension Mother   . Diabetes Mother   . Depression Mother   . Mental illness Mother   . Diabetes Father   . Heart attack Maternal Grandmother   . Heart disease Maternal Grandmother   . Diabetes Maternal Grandmother   . Diabetes Maternal Grandfather   . Diabetes Paternal Grandmother   . Diabetes Paternal Grandfather    History  Substance Use Topics  . Smoking status: Never Smoker   . Smokeless tobacco: Never Used  . Alcohol Use: Yes     Comment: occ   OB History    No data available     Review of Systems  ROS reviewed and all otherwise negative except for mentioned in HPI    Allergies  Penicillins;  Bee venom; and Hydrocodone-acetaminophen  Home Medications   Prior to Admission medications   Medication Sig Start Date End Date Taking? Authorizing Provider  acetaminophen (TYLENOL) 500 MG tablet Take 2,000 mg by mouth once as needed (severe pain).   Yes Historical Provider, MD  ciprofloxacin (CIPRO) 500 MG tablet Take 1 tablet (500 mg total) by mouth 2 (two) times daily. 09/11/14  Yes Vanetta MuldersScott Zackowski, MD  clindamycin (CLEOCIN) 300 MG capsule Take 1 capsule (300 mg total) by mouth 4 (four) times daily. Patient not taking: Reported on 08/20/2014 04/11/14   Ward GivensIva L Knapp, MD  dicyclomine (BENTYL) 20 MG tablet Take 1 tablet (20 mg total) by mouth every 6 (six) hours as needed for spasms (abdominal cramping). Patient not taking: Reported on 09/11/2014 08/20/14   Samuel JesterKathleen McManus, DO  fluconazole (DIFLUCAN) 150 MG tablet 1 po stat; repeat in 3 days Patient not taking: Reported on 08/20/2014 05/13/14   Jacklyn ShellFrances Cresenzo-Dishmon, CNM  HYDROcodone-acetaminophen (NORCO/VICODIN) 5-325 MG per tablet Take 1 tablet by mouth every 6 (six) hours as needed. Patient not taking: Reported on 08/20/2014 04/28/14   Benny LennertJoseph L Zammit, MD  naproxen (NAPROSYN) 500 MG tablet Take 1 tablet (500 mg total) by mouth 2 (two) times daily. Patient not taking: Reported on  09/14/2014 09/11/14   Vanetta Mulders, MD  ondansetron (ZOFRAN ODT) 4 MG disintegrating tablet  ODT q4 hours prn nausea/vomit Patient not taking: Reported on 08/20/2014 04/28/14   Benny Lennert, MD  ondansetron (ZOFRAN ODT) 4 MG disintegrating tablet  ODT q4 hours prn nausea/vomit Patient not taking: Reported on 08/20/2014 04/28/14   Benny Lennert, MD  ondansetron (ZOFRAN ODT) 4 MG disintegrating tablet Take 1 tablet (4 mg total) by mouth every 8 (eight) hours as needed. Patient not taking: Reported on 09/14/2014 09/11/14   Vanetta Mulders, MD  ondansetron (ZOFRAN) 4 MG tablet Take 1 tablet (4 mg total) by mouth every 8 (eight) hours as needed for nausea or  vomiting. Patient not taking: Reported on 09/11/2014 08/20/14   Samuel Jester, DO  oxyCODONE-acetaminophen (PERCOCET/ROXICET) 5-325 MG per tablet Take 1 tablet by mouth every 6 (six) hours as needed for severe pain. Patient not taking: Reported on 08/20/2014 04/11/14   Ward Givens, MD  oxyCODONE-acetaminophen (PERCOCET/ROXICET) 5-325 MG per tablet Take 1 tablet by mouth every 6 (six) hours as needed. Patient not taking: Reported on 08/20/2014 04/28/14   Benny Lennert, MD  phenazopyridine (PYRIDIUM) 200 MG tablet Take 1 tablet (200 mg total) by mouth 3 (three) times daily. 09/14/14   Ethelda Chick, MD  sulfamethoxazole-trimethoprim (SEPTRA DS) 800-160 MG per tablet Take 1 tablet by mouth 2 (two) times daily. 09/14/14   Ethelda Chick, MD   BP 97/56 mmHg  Pulse 79  Temp(Src) 98 F (36.7 C) (Oral)  Resp 20  Ht  (1.854 m)  Wt 318 lb (144.244 kg)  BMI 41.96 kg/m2  SpO2 94%  LMP 08/31/2014 (Exact Date)  Vitals reviewed Physical Exam  Physical Examination: General appearance - alert, well appearing, and in no distress Mental status - alert, oriented to person, place, and time Eyes - no conjunctival injection, no scleral icterus Mouth - mucous membranes moist, pharynx normal without lesions Chest - clear to auscultation, no wheezes, rales or rhonchi, symmetric air entry Heart - normal rate, regular rhythm, normal S1, S2, no murmurs, rubs, clicks or gallops Abdomen - soft, nontender, nondistended, no masses or organomegaly Back exam - no midline tenderness to palpation, mild ttp over left lumbar paraspinal distribution, mild left CVA tenderness Extremities - peripheral pulses normal, no pedal edema, no clubbing or cyanosis Skin - normal coloration and turgor, no rashes  ED Course  Procedures (including critical care time) Labs Review Labs Reviewed  URINALYSIS W MICROSCOPIC - Abnormal; Notable for the following:    APPearance CLOUDY (*)    Glucose, UA 100 (*)    Leukocytes, UA SMALL (*)     Bacteria, UA MANY (*)    Squamous Epithelial / LPF FEW (*)    All other components within normal limits  BASIC METABOLIC PANEL - Abnormal; Notable for the following:    Glucose, Bld 220 (*)    All other components within normal limits  URINE CULTURE  PREGNANCY, URINE  CBC    Imaging Review No results found.   EKG Interpretation None      MDM   Final diagnoses:  Urinary tract infection without hematuria, site unspecified    Pt presenting with left flank pain, has been taking cipro for UTI x 48 hours- urine still shows WBCs and bacteria. Urine culture shows > 100K ecoli, no sensititivies reported on this in chart that I can find.  Will resend urine culture.  Added bactrim in case of cipro resistance.  Pt is  allergic to pcn.  Discharged with strict return precautions.  Pt agreeable with plan.    Ethelda Chick, MD 09/14/14 [phone]

## 2014-09-14 NOTE — ED Notes (Signed)
Patient here with complaint of left flank pain. States that the pain began Wednesday of last week. Was seen at [facility] for the same. Was prescribed Cipro and has been taking as ordered since Friday. States pain is unchanged.

## 2014-09-15 ENCOUNTER — Telehealth (HOSPITAL_BASED_OUTPATIENT_CLINIC_OR_DEPARTMENT_OTHER): Payer: Self-pay | Admitting: Emergency Medicine

## 2014-09-15 LAB — URINE CULTURE: Colony Count: 50000

## 2014-09-15 NOTE — Telephone Encounter (Signed)
Post ED Visit - Positive Culture Follow-up  Culture report reviewed by antimicrobial stewardship pharmacist: []  Wes Dulaney, Pharm.D., BCPS [x]  [name], Pharm.D., BCPS []  [name], Pharm.D., BCPS []  DennisonMinh Pham, [address]Ph[address]., BCPS, AAHIVP []  [name], Pharm.D., BCPS, AAHIVP []  Elder CyphersLorie Poole, [address]Ph[address]D., BCPS  Positive urine culture E. Coli Treated with ciprofloxacin, organism sensitive to the same and no further patient follow-up is required at this time.  Berle MullMiller, Charly Hunton 09/15/2014, 10:35 AM

## 2014-11-27 ENCOUNTER — Encounter (HOSPITAL_COMMUNITY): Payer: Self-pay | Admitting: Physical Medicine and Rehabilitation

## 2014-11-27 ENCOUNTER — Emergency Department (HOSPITAL_COMMUNITY)
Admission: EM | Admit: 2014-11-27 | Discharge: 2014-11-27 | Disposition: A | Discharge status: Home / Self Care | Payer: BLUE CROSS/BLUE SHIELD | Attending: Emergency Medicine | Admitting: Emergency Medicine

## 2014-11-27 DIAGNOSIS — J45909 Unspecified asthma, uncomplicated: Secondary | ICD-10-CM | POA: Diagnosis not present

## 2014-11-27 DIAGNOSIS — R109 Unspecified abdominal pain: Secondary | ICD-10-CM

## 2014-11-27 DIAGNOSIS — Z79899 Other long term (current) drug therapy: Secondary | ICD-10-CM | POA: Diagnosis not present

## 2014-11-27 DIAGNOSIS — R1030 Lower abdominal pain, unspecified: Secondary | ICD-10-CM | POA: Insufficient documentation

## 2014-11-27 DIAGNOSIS — Z880 Allergy status to penicillin: Secondary | ICD-10-CM | POA: Insufficient documentation

## 2014-11-27 DIAGNOSIS — E119 Type 2 diabetes mellitus without complications: Secondary | ICD-10-CM | POA: Diagnosis not present

## 2014-11-27 DIAGNOSIS — Z8619 Personal history of other infectious and parasitic diseases: Secondary | ICD-10-CM | POA: Diagnosis not present

## 2014-11-27 DIAGNOSIS — Z87442 Personal history of urinary calculi: Secondary | ICD-10-CM | POA: Diagnosis not present

## 2014-11-27 DIAGNOSIS — Z3202 Encounter for pregnancy test, result negative: Secondary | ICD-10-CM | POA: Diagnosis not present

## 2014-11-27 DIAGNOSIS — Z872 Personal history of diseases of the skin and subcutaneous tissue: Secondary | ICD-10-CM | POA: Insufficient documentation

## 2014-11-27 DIAGNOSIS — N390 Urinary tract infection, site not specified: Secondary | ICD-10-CM | POA: Insufficient documentation

## 2014-11-27 LAB — URINALYSIS, ROUTINE W REFLEX MICROSCOPIC
Bilirubin Urine: NEGATIVE
Glucose, UA: NEGATIVE mg/dL
HGB URINE DIPSTICK: NEGATIVE
Ketones, ur: NEGATIVE mg/dL
NITRITE: POSITIVE — AB
PROTEIN: NEGATIVE mg/dL
SPECIFIC GRAVITY, URINE: 1.026 (ref 1.005–1.030)
UROBILINOGEN UA: 0.2 mg/dL (ref 0.0–1.0)
pH: 6.5 (ref 5.0–8.0)

## 2014-11-27 LAB — COMPREHENSIVE METABOLIC PANEL
ALT: 51 U/L — AB (ref 0–35)
ANION GAP: 10 (ref 5–15)
AST: 36 U/L (ref 0–37)
Albumin: 4 g/dL (ref 3.5–5.2)
Alkaline Phosphatase: 64 U/L (ref 39–117)
BUN: 10 mg/dL (ref 6–23)
CALCIUM: 9.4 mg/dL (ref 8.4–10.5)
CO2: 28 mmol/L (ref 19–32)
Chloride: 103 mmol/L (ref 96–112)
Creatinine, Ser: 0.73 mg/dL (ref 0.50–1.10)
GFR calc non Af Amer: >90 mL/min (ref >90)
GLUCOSE: 173 mg/dL — AB (ref 70–99)
Potassium: 3.7 mmol/L (ref 3.5–5.1)
Sodium: 141 mmol/L (ref 135–145)
Total Bilirubin: 0.7 mg/dL (ref 0.3–1.2)
Total Protein: 7.5 g/dL (ref 6.0–8.3)

## 2014-11-27 LAB — CBC WITH DIFFERENTIAL/PLATELET
BASOS ABS: 0 10*3/uL (ref 0.0–0.1)
Basophils Relative: 0 % (ref 0–1)
EOS ABS: 0.1 10*3/uL (ref 0.0–0.7)
Eosinophils Relative: 2 % (ref 0–5)
HEMATOCRIT: 40.8 % (ref 36.0–46.0)
HEMOGLOBIN: 13.3 g/dL (ref 12.0–15.0)
LYMPHS ABS: 3.5 10*3/uL (ref 0.7–4.0)
LYMPHS PCT: 44 % (ref 12–46)
MCH: 28.2 pg (ref 26.0–34.0)
MCHC: 32.6 g/dL (ref 30.0–36.0)
MCV: 86.4 fL (ref 78.0–100.0)
Monocytes Absolute: 0.5 10*3/uL (ref 0.1–1.0)
Monocytes Relative: 6 % (ref 3–12)
NEUTROS ABS: 3.8 10*3/uL (ref 1.7–7.7)
Neutrophils Relative %: 48 % (ref 43–77)
Platelets: 271 10*3/uL (ref 150–400)
RBC: 4.72 MIL/uL (ref 3.87–5.11)
RDW: 13.1 % (ref 11.5–15.5)
WBC: 7.9 10*3/uL (ref 4.0–10.5)

## 2014-11-27 LAB — URINE MICROSCOPIC-ADD ON

## 2014-11-27 LAB — POC URINE PREG, ED: Preg Test, Ur: NEGATIVE

## 2014-11-27 MED ORDER — HYDROMORPHONE HCL 1 MG/ML IJ SOLN
1.0000 mg | Freq: Once | INTRAMUSCULAR | Status: AC
  Administered 2014-11-27: 1 mg via INTRAVENOUS
  Filled 2014-11-27: qty 1

## 2014-11-27 MED ORDER — CIPROFLOXACIN IN D5W 400 MG/200ML IV SOLN
400.0000 mg | Freq: Once | INTRAVENOUS | Status: AC
  Administered 2014-11-27: 400 mg via INTRAVENOUS
  Filled 2014-11-27: qty 200

## 2014-11-27 MED ORDER — ONDANSETRON HCL 4 MG/2ML IJ SOLN
4.0000 mg | Freq: Once | INTRAMUSCULAR | Status: AC
  Administered 2014-11-27: 4 mg via INTRAVENOUS
  Filled 2014-11-27: qty 2

## 2014-11-27 MED ORDER — CIPROFLOXACIN HCL 500 MG PO TABS: 500.0000 mg | ORAL_TABLET | Freq: Two times a day (BID) | ORAL | Status: DC

## 2014-11-27 NOTE — ED Notes (Signed)
Assisted patient to rest room. Collected urine sample. 

## 2014-11-27 NOTE — ED Provider Notes (Signed)
CSN: [id number]     Arrival date & time 11/27/14  4098 History   First MD Initiated Contact with Patient 11/27/14 1021     Chief Complaint  Patient presents with  . Flank Pain     (Consider location/radiation/quality/duration/timing/severity/associated sxs/prior Treatment) HPI  Pt with hx of renal stones and pyelo presents with c/o left flank pain.  She states the pain started last night.  Currently 10/10 pain- states this feels like prior renal stones.  No fever/chills.  No vomiting.  Has had some urinary frequency, but no dysuria, no blood in urine.  No lower abdominal pain.  Pt was treated in 2/16 per chart review for pyelo- urine culture was + for ecoli and pansensitive.  She states she improved after last illness, and now has similar symptoms again.  There are no other associated systemic symptoms, there are no other alleviating or modifying factors.   Past Medical History  Diagnosis Date  . Kidney stones   . Herpes     anal  . Polycystic ovarian disease   . Diabetes mellitus without complication   . Asthma   . Headache(784.0)   . Hx of chlamydia infection   . History of PCOS   . Depression 11/25/2012  . Dyslipidemia 12/31/2012  . Vaginal itching 03/02/2014  . Yeast infection 03/02/2014  . Kidney stones    Past Surgical History  Procedure Laterality Date  . Inner ear surgery    . Arm hardware removal     Family History  Problem Relation Age of Onset  . Hypertension Mother   . Diabetes Mother   . Depression Mother   . Mental illness Mother   . Diabetes Father   . Heart attack Maternal Grandmother   . Heart disease Maternal Grandmother   . Diabetes Maternal Grandmother   . Diabetes Maternal Grandfather   . Diabetes Paternal Grandmother   . Diabetes Paternal Grandfather    History  Substance Use Topics  . Smoking status: Never Smoker   . Smokeless tobacco: Never Used  . Alcohol Use: Yes     Comment: occ   OB History    No data available     Review of Systems   ROS reviewed and all otherwise negative except for mentioned in HPI    Allergies  Penicillins; Bee venom; Hydrocodone-acetaminophen; and Other  Home Medications   Prior to Admission medications   Medication Sig Start Date End Date Taking? Authorizing Provider  acetaminophen (TYLENOL) 500 MG tablet Take 2,000 mg by mouth every 6 (six) hours as needed (severe pain).    Yes Historical Provider, MD  Cyanocobalamin (B-12 PO) Take 1 tablet by mouth daily.   Yes Historical Provider, MD  ciprofloxacin (CIPRO) 500 MG tablet Take 1 tablet (500 mg total) by mouth 2 (two) times daily. 11/27/14   Jerelyn Scott, MD  clindamycin (CLEOCIN) 300 MG capsule Take 1 capsule (300 mg total) by mouth 4 (four) times daily. Patient not taking: Reported on 08/20/2014 04/11/14   Devoria Albe, MD  dicyclomine (BENTYL) 20 MG tablet Take 1 tablet (20 mg total) by mouth every 6 (six) hours as needed for spasms (abdominal cramping). Patient not taking: Reported on 09/11/2014 08/20/14   Samuel Jester, DO  fluconazole (DIFLUCAN) 150 MG tablet 1 po stat; repeat in 3 days Patient not taking: Reported on 08/20/2014 05/13/14   Jacklyn Shell, CNM  HYDROcodone-acetaminophen (NORCO/VICODIN) 5-325 MG per tablet Take 1 tablet by mouth every 6 (six) hours as needed. Patient not taking: Reported  on 08/20/2014 04/28/14   Bethann Berkshire, MD  naproxen (NAPROSYN) 500 MG tablet Take 1 tablet (500 mg total) by mouth 2 (two) times daily. Patient not taking: Reported on 09/14/2014 09/11/14   Vanetta Mulders, MD  ondansetron (ZOFRAN ODT) 4 MG disintegrating tablet  ODT q4 hours prn nausea/vomit Patient not taking: Reported on 08/20/2014 04/28/14   Bethann Berkshire, MD  ondansetron (ZOFRAN ODT) 4 MG disintegrating tablet  ODT q4 hours prn nausea/vomit Patient not taking: Reported on 08/20/2014 04/28/14   Bethann Berkshire, MD  ondansetron (ZOFRAN ODT) 4 MG disintegrating tablet Take 1 tablet (4 mg total) by mouth every 8 (eight) hours as  needed. Patient not taking: Reported on 09/14/2014 09/11/14   Vanetta Mulders, MD  ondansetron (ZOFRAN) 4 MG tablet Take 1 tablet (4 mg total) by mouth every 8 (eight) hours as needed for nausea or vomiting. Patient not taking: Reported on 09/11/2014 08/20/14   Samuel Jester, DO  oxyCODONE-acetaminophen (PERCOCET/ROXICET) 5-325 MG per tablet Take 1 tablet by mouth every 6 (six) hours as needed for severe pain. Patient not taking: Reported on 08/20/2014 04/11/14   Devoria Albe, MD  oxyCODONE-acetaminophen (PERCOCET/ROXICET) 5-325 MG per tablet Take 1 tablet by mouth every 6 (six) hours as needed. Patient not taking: Reported on 08/20/2014 04/28/14   Bethann Berkshire, MD  phenazopyridine (PYRIDIUM) 200 MG tablet Take 1 tablet (200 mg total) by mouth 3 (three) times daily. Patient not taking: Reported on 11/27/2014 09/14/14   Jerelyn Scott, MD  sulfamethoxazole-trimethoprim (SEPTRA DS) 800-160 MG per tablet Take 1 tablet by mouth 2 (two) times daily. Patient not taking: Reported on 11/27/2014 09/14/14   Jerelyn Scott, MD   BP 120/68 mmHg  Pulse 62  Temp(Src) 98.1 F (36.7 C) (Oral)  Resp 11  SpO2 96%  Vitals reivewed Physical Exam  Physical Examination: General appearance - alert, well appearing, and in no distress Mental status - alert, oriented to person, place, and time Eyes - no conjunctival injection, no scleral icterus Mouth - mucous membranes moist, pharynx normal without lesions Chest - clear to auscultation, no wheezes, rales or rhonchi, symmetric air entry Heart - normal rate, regular rhythm, normal S1, S2, no murmurs, rubs, clicks or gallops Abdomen - soft, nontender, nondistended, no masses or organomegaly Back exam - full range of motion, no midline tenderness, left CVA tenderness Extremities - peripheral pulses normal, no pedal edema, no clubbing or cyanosis Skin - normal coloration and turgor, no rashes  ED Course  Procedures (including critical care time) Labs Review Labs Reviewed   URINALYSIS, ROUTINE W REFLEX MICROSCOPIC - Abnormal; Notable for the following:    Color, Urine AMBER (*)    APPearance CLOUDY (*)    Nitrite POSITIVE (*)    Leukocytes, UA SMALL (*)    All other components within normal limits  COMPREHENSIVE METABOLIC PANEL - Abnormal; Notable for the following:    Glucose, Bld 173 (*)    ALT 51 (*)    All other components within normal limits  URINE MICROSCOPIC-ADD ON - Abnormal; Notable for the following:    Squamous Epithelial / LPF MANY (*)    Bacteria, UA MANY (*)    All other components within normal limits  URINE CULTURE  CBC WITH DIFFERENTIAL/PLATELET  POC URINE PREG, ED    Imaging Review No results found.   EKG Interpretation None      MDM   Final diagnoses:  UTI (lower urinary tract infection)  Flank pain    Pt with left flank pain and  urinary frequency.  Urine c/w UTI- no hematuria to suggest renal stone.   Labs reassuring normal renal function.  Pt started on cipro after prior culture results reviewed, urine culture sent as well.  Pt with improved pain after iV meds in the ED.  Discharged with strict return precautions.  Pt agreeable with plan.   Jerelyn ScottMartha Linker, MD 11/27/14 [phone]

## 2014-11-27 NOTE — Discharge Instructions (Signed)
Return to the ED with any concerns including vomiting and not able to keep down liquids, worsening pain, fever/chills despite taking antibiotics, decreased level of alertness/lethargy, or any other alarming symptoms

## 2014-11-27 NOTE — ED Notes (Signed)
Pt presents to department for evaluation of R sided flank pain. Reports history of kidney stones. 10/10 pain upon arrival to ED. Pt is alert and oriented x4.

## 2014-11-29 LAB — URINE CULTURE: Colony Count: >=100000

## 2014-12-07 ENCOUNTER — Emergency Department (HOSPITAL_COMMUNITY)
Admission: EM | Admit: 2014-12-07 | Discharge: 2014-12-07 | Disposition: A | Discharge status: Home / Self Care | Payer: BLUE CROSS/BLUE SHIELD | Attending: Emergency Medicine | Admitting: Emergency Medicine

## 2014-12-07 ENCOUNTER — Encounter (HOSPITAL_COMMUNITY): Payer: Self-pay

## 2014-12-07 DIAGNOSIS — Y998 Other external cause status: Secondary | ICD-10-CM | POA: Diagnosis not present

## 2014-12-07 DIAGNOSIS — Z8659 Personal history of other mental and behavioral disorders: Secondary | ICD-10-CM | POA: Diagnosis not present

## 2014-12-07 DIAGNOSIS — Y9389 Activity, other specified: Secondary | ICD-10-CM | POA: Insufficient documentation

## 2014-12-07 DIAGNOSIS — E119 Type 2 diabetes mellitus without complications: Secondary | ICD-10-CM | POA: Insufficient documentation

## 2014-12-07 DIAGNOSIS — Z792 Long term (current) use of antibiotics: Secondary | ICD-10-CM | POA: Insufficient documentation

## 2014-12-07 DIAGNOSIS — Z8639 Personal history of other endocrine, nutritional and metabolic disease: Secondary | ICD-10-CM | POA: Diagnosis not present

## 2014-12-07 DIAGNOSIS — Z791 Long term (current) use of non-steroidal anti-inflammatories (NSAID): Secondary | ICD-10-CM | POA: Insufficient documentation

## 2014-12-07 DIAGNOSIS — S30860A Insect bite (nonvenomous) of lower back and pelvis, initial encounter: Secondary | ICD-10-CM | POA: Diagnosis present

## 2014-12-07 DIAGNOSIS — Z79899 Other long term (current) drug therapy: Secondary | ICD-10-CM | POA: Insufficient documentation

## 2014-12-07 DIAGNOSIS — Z87442 Personal history of urinary calculi: Secondary | ICD-10-CM | POA: Insufficient documentation

## 2014-12-07 DIAGNOSIS — J45909 Unspecified asthma, uncomplicated: Secondary | ICD-10-CM | POA: Insufficient documentation

## 2014-12-07 DIAGNOSIS — Y9289 Other specified places as the place of occurrence of the external cause: Secondary | ICD-10-CM | POA: Diagnosis not present

## 2014-12-07 DIAGNOSIS — Z880 Allergy status to penicillin: Secondary | ICD-10-CM | POA: Diagnosis not present

## 2014-12-07 DIAGNOSIS — W57XXXA Bitten or stung by nonvenomous insect and other nonvenomous arthropods, initial encounter: Secondary | ICD-10-CM | POA: Diagnosis not present

## 2014-12-07 DIAGNOSIS — Z8619 Personal history of other infectious and parasitic diseases: Secondary | ICD-10-CM | POA: Diagnosis not present

## 2014-12-07 MED ORDER — PREDNISONE 10 MG PO TABS: ORAL_TABLET | ORAL | Status: DC

## 2014-12-07 MED ORDER — HYDROXYZINE HCL 50 MG/ML IM SOLN
50.0000 mg | Freq: Once | INTRAMUSCULAR | Status: AC
  Administered 2014-12-07: 50 mg via INTRAMUSCULAR
  Filled 2014-12-07: qty 1

## 2014-12-07 MED ORDER — HYDROXYZINE HCL 25 MG PO TABS: 25.0000 mg | ORAL_TABLET | Freq: Four times a day (QID) | ORAL | Status: DC | PRN

## 2014-12-07 MED ORDER — DEXAMETHASONE SODIUM PHOSPHATE 10 MG/ML IJ SOLN
10.0000 mg | Freq: Once | INTRAMUSCULAR | Status: AC
  Administered 2014-12-07: 10 mg via INTRAMUSCULAR
  Filled 2014-12-07: qty 1

## 2014-12-07 NOTE — ED Provider Notes (Signed)
CSN: [id number]     Arrival date & time 12/07/14  1851 History  This chart was scribed for non-physician practitioner, Burgess Amor, PA-C, working with Bethann Berkshire, MD, by Modena Jansky, ED Scribe. This patient was seen in room APFT20/APFT[facility] and the patient's care was started at 7:50 PM.  Chief Complaint  Patient presents with  . Insect Bite   The history is provided by the patient. No language interpreter was used.    HPI Comments: Rachael Collier is a 29 y.o. female who presents to the Emergency Department complaining of a constant moderate rash that started yesterday. She reports that she was outside last night, and may be bitten by mosquitoes then. She describes the rash on her torso and extremities as red and itchy. She states that she has been using cortisone cream and claritin without relief. She denies any recent use of new products. She denies any fever, SOB, or cough.   PCP- Margo Aye  Past Medical History  Diagnosis Date  . Kidney stones   . Herpes     anal  . Polycystic ovarian disease   . Diabetes mellitus without complication   . Asthma   . Headache(784.0)   . Hx of chlamydia infection   . History of PCOS   . Depression 11/25/2012  . Dyslipidemia 12/31/2012  . Vaginal itching 03/02/2014  . Yeast infection 03/02/2014  . Kidney stones    Past Surgical History  Procedure Laterality Date  . Inner ear surgery    . Arm hardware removal     Family History  Problem Relation Age of Onset  . Hypertension Mother   . Diabetes Mother   . Depression Mother   . Mental illness Mother   . Diabetes Father   . Heart attack Maternal Grandmother   . Heart disease Maternal Grandmother   . Diabetes Maternal Grandmother   . Diabetes Maternal Grandfather   . Diabetes Paternal Grandmother   . Diabetes Paternal Grandfather    History  Substance Use Topics  . Smoking status: Never Smoker   . Smokeless tobacco: Never Used  . Alcohol Use: Yes     Comment: occ   OB History    No data  available     Review of Systems  Constitutional: Negative for fever.  HENT: Negative for congestion and sore throat.   Eyes: Negative.   Respiratory: Negative for cough, chest tightness and shortness of breath.   Cardiovascular: Negative for chest pain.  Gastrointestinal: Negative for nausea and abdominal pain.  Genitourinary: Negative.   Musculoskeletal: Negative for joint swelling, arthralgias and neck pain.  Skin: Positive for rash. Negative for wound.  Neurological: Negative for dizziness, weakness, light-headedness, numbness and headaches.  Psychiatric/Behavioral: Negative.    Allergies  Penicillins; Bee venom; Hydrocodone-acetaminophen; and Other  Home Medications   Prior to Admission medications   Medication Sig Start Date End Date Taking? Authorizing Provider  acetaminophen (TYLENOL) 500 MG tablet Take 2,000 mg by mouth every 6 (six) hours as needed (severe pain).     Historical Provider, MD  ciprofloxacin (CIPRO) 500 MG tablet Take 1 tablet (500 mg total) by mouth 2 (two) times daily. 11/27/14   Jerelyn Scott, MD  clindamycin (CLEOCIN) 300 MG capsule Take 1 capsule (300 mg total) by mouth 4 (four) times daily. Patient not taking: Reported on 08/20/2014 04/11/14   Devoria Albe, MD  Cyanocobalamin (B-12 PO) Take 1 tablet by mouth daily.    Historical Provider, MD  dicyclomine (BENTYL) 20 MG tablet Take  1 tablet (20 mg total) by mouth every 6 (six) hours as needed for spasms (abdominal cramping). Patient not taking: Reported on 09/11/2014 08/20/14   Samuel Jester, DO  fluconazole (DIFLUCAN) 150 MG tablet 1 po stat; repeat in 3 days Patient not taking: Reported on 08/20/2014 05/13/14   Jacklyn Shell, CNM  HYDROcodone-acetaminophen (NORCO/VICODIN) 5-325 MG per tablet Take 1 tablet by mouth every 6 (six) hours as needed. Patient not taking: Reported on 08/20/2014 04/28/14   Bethann Berkshire, MD  hydrOXYzine (ATARAX/VISTARIL) 25 MG tablet Take 1 tablet (25 mg total) by mouth every 6  (six) hours as needed for itching. 12/07/14   Burgess Amor, PA-C  naproxen (NAPROSYN) 500 MG tablet Take 1 tablet (500 mg total) by mouth 2 (two) times daily. Patient not taking: Reported on 09/14/2014 09/11/14   Vanetta Mulders, MD  ondansetron (ZOFRAN ODT) 4 MG disintegrating tablet  ODT q4 hours prn nausea/vomit Patient not taking: Reported on 08/20/2014 04/28/14   Bethann Berkshire, MD  ondansetron (ZOFRAN ODT) 4 MG disintegrating tablet  ODT q4 hours prn nausea/vomit Patient not taking: Reported on 08/20/2014 04/28/14   Bethann Berkshire, MD  ondansetron (ZOFRAN ODT) 4 MG disintegrating tablet Take 1 tablet (4 mg total) by mouth every 8 (eight) hours as needed. Patient not taking: Reported on 09/14/2014 09/11/14   Vanetta Mulders, MD  ondansetron (ZOFRAN) 4 MG tablet Take 1 tablet (4 mg total) by mouth every 8 (eight) hours as needed for nausea or vomiting. Patient not taking: Reported on 09/11/2014 08/20/14   Samuel Jester, DO  oxyCODONE-acetaminophen (PERCOCET/ROXICET) 5-325 MG per tablet Take 1 tablet by mouth every 6 (six) hours as needed for severe pain. Patient not taking: Reported on 08/20/2014 04/11/14   Devoria Albe, MD  oxyCODONE-acetaminophen (PERCOCET/ROXICET) 5-325 MG per tablet Take 1 tablet by mouth every 6 (six) hours as needed. Patient not taking: Reported on 08/20/2014 04/28/14   Bethann Berkshire, MD  phenazopyridine (PYRIDIUM) 200 MG tablet Take 1 tablet (200 mg total) by mouth 3 (three) times daily. Patient not taking: Reported on 11/27/2014 09/14/14   Jerelyn Scott, MD  predniSONE (DELTASONE) 10 MG tablet 6, 5, 4, 3, 2 then 1 tablet by mouth daily for 6 days total. 12/07/14   Burgess Amor, PA-C  sulfamethoxazole-trimethoprim (SEPTRA DS) 800-160 MG per tablet Take 1 tablet by mouth 2 (two) times daily. Patient not taking: Reported on 11/27/2014 09/14/14   Jerelyn Scott, MD   BP 140/64 mmHg  Pulse 78  Temp(Src) 98 F (36.7 C) (Oral)  Resp 24  Ht  (1.854 m)  Wt 315 lb 8 oz (143.11 kg)  BMI 41.63  kg/m2  SpO2 99%  LMP  (LMP Unknown) Physical Exam  Constitutional: She appears well-developed and well-nourished. No distress.  HENT:  Head: Normocephalic.  Neck: Neck supple.  Cardiovascular: Normal rate.   Pulmonary/Chest: Effort normal. She has no wheezes.  Musculoskeletal: Normal range of motion. She exhibits no edema.  Skin: Rash noted.  Multiple raised papules that are erythematous and approximately 1-2 cm in diameter around lower back. 2 patches of erythema 10 cm with coalesce bites and excoriation. No drainage. No pustules or blisters. No red streaking. No fluctuance.   Nursing note and vitals reviewed.   ED Course  Procedures (including critical care time) DIAGNOSTIC STUDIES: Oxygen Saturation is 99% on RA, normal by my interpretation.    COORDINATION OF CARE: 7:54 PM- Pt advised of plan for treatment which includes medication and pt agrees.  Labs Review Labs Reviewed - No  data to display  Imaging Review No results found.   EKG Interpretation None      MDM   Final diagnoses:  Multiple insect bites    Exam c/w insect bites, primarily across her lower back, probably mosquitos with exaggerated inflammatory response.  She was encouraged ice to help with histamine reaction, placed on prednisone taper, vistaril.  Gold Bond anti itch cream.  Plan f/u with pcp for a recheck if sx worsen or do not improve.  I personally performed the services described in this documentation, which was scribed in my presence. The recorded information has been reviewed and is accurate.    Tabytha Gradillas, PA-C Burgess Amor05/05/16 0011  Bethann BerkshireJoseph Zammit, MD 12/10/14 1325

## 2014-12-07 NOTE — ED Notes (Signed)
Patient c/o bug bites to lower back/flanks and inner arms started yesterday.

## 2014-12-07 NOTE — Discharge Instructions (Signed)

## 2014-12-07 NOTE — ED Notes (Signed)
PA Julie at bedside.  

## 2015-02-05 ENCOUNTER — Encounter (HOSPITAL_COMMUNITY): Payer: Self-pay | Admitting: *Deleted

## 2015-02-05 ENCOUNTER — Emergency Department (HOSPITAL_COMMUNITY)
Admission: EM | Admit: 2015-02-05 | Discharge: 2015-02-05 | Disposition: A | Discharge status: Home / Self Care | Payer: BLUE CROSS/BLUE SHIELD | Attending: Emergency Medicine | Admitting: Emergency Medicine

## 2015-02-05 DIAGNOSIS — Y998 Other external cause status: Secondary | ICD-10-CM | POA: Insufficient documentation

## 2015-02-05 DIAGNOSIS — W57XXXA Bitten or stung by nonvenomous insect and other nonvenomous arthropods, initial encounter: Secondary | ICD-10-CM

## 2015-02-05 DIAGNOSIS — Z872 Personal history of diseases of the skin and subcutaneous tissue: Secondary | ICD-10-CM | POA: Insufficient documentation

## 2015-02-05 DIAGNOSIS — Z8742 Personal history of other diseases of the female genital tract: Secondary | ICD-10-CM | POA: Insufficient documentation

## 2015-02-05 DIAGNOSIS — Y9389 Activity, other specified: Secondary | ICD-10-CM | POA: Insufficient documentation

## 2015-02-05 DIAGNOSIS — Z880 Allergy status to penicillin: Secondary | ICD-10-CM | POA: Insufficient documentation

## 2015-02-05 DIAGNOSIS — Y9289 Other specified places as the place of occurrence of the external cause: Secondary | ICD-10-CM | POA: Insufficient documentation

## 2015-02-05 DIAGNOSIS — J45909 Unspecified asthma, uncomplicated: Secondary | ICD-10-CM | POA: Insufficient documentation

## 2015-02-05 DIAGNOSIS — Z87442 Personal history of urinary calculi: Secondary | ICD-10-CM | POA: Insufficient documentation

## 2015-02-05 DIAGNOSIS — X58XXXA Exposure to other specified factors, initial encounter: Secondary | ICD-10-CM | POA: Insufficient documentation

## 2015-02-05 DIAGNOSIS — T63481A Toxic effect of venom of other arthropod, accidental (unintentional), initial encounter: Secondary | ICD-10-CM | POA: Insufficient documentation

## 2015-02-05 DIAGNOSIS — Z8659 Personal history of other mental and behavioral disorders: Secondary | ICD-10-CM | POA: Insufficient documentation

## 2015-02-05 DIAGNOSIS — Z8619 Personal history of other infectious and parasitic diseases: Secondary | ICD-10-CM | POA: Insufficient documentation

## 2015-02-05 DIAGNOSIS — E119 Type 2 diabetes mellitus without complications: Secondary | ICD-10-CM | POA: Insufficient documentation

## 2015-02-05 DIAGNOSIS — Z79899 Other long term (current) drug therapy: Secondary | ICD-10-CM | POA: Insufficient documentation

## 2015-02-05 LAB — CBG MONITORING, ED: GLUCOSE-CAPILLARY: 131 mg/dL — AB (ref 65–99)

## 2015-02-05 MED ORDER — HYDROXYZINE HCL 25 MG PO TABS
25.0000 mg | ORAL_TABLET | Freq: Once | ORAL | Status: AC
  Administered 2015-02-05: 25 mg via ORAL
  Filled 2015-02-05: qty 1

## 2015-02-05 MED ORDER — PREDNISONE 20 MG PO TABS
40.0000 mg | ORAL_TABLET | Freq: Once | ORAL | Status: AC
  Administered 2015-02-05: 40 mg via ORAL
  Filled 2015-02-05: qty 2

## 2015-02-05 MED ORDER — SULFAMETHOXAZOLE-TRIMETHOPRIM 800-160 MG PO TABS
1.0000 | ORAL_TABLET | Freq: Once | ORAL | Status: AC
  Administered 2015-02-05: 1 via ORAL
  Filled 2015-02-05: qty 1

## 2015-02-05 MED ORDER — PREDNISONE 10 MG PO TABS: 20.0000 mg | ORAL_TABLET | Freq: Two times a day (BID) | ORAL | Status: DC

## 2015-02-05 MED ORDER — HYDROXYZINE HCL 25 MG PO TABS: 25.0000 mg | ORAL_TABLET | Freq: Four times a day (QID) | ORAL | Status: DC

## 2015-02-05 MED ORDER — SULFAMETHOXAZOLE-TRIMETHOPRIM 800-160 MG PO TABS: 1.0000 | ORAL_TABLET | Freq: Two times a day (BID) | ORAL | Status: AC

## 2015-02-05 NOTE — ED Provider Notes (Signed)
CSN: [id number]     Arrival date & time 02/05/15  2200 History   First MD Initiated Contact with Patient 02/05/15 2230     Chief Complaint  Patient presents with  . Recurrent Skin Infections     (Consider location/radiation/quality/duration/timing/severity/associated sxs/prior Treatment) Patient is a 29 y.o. female presenting with rash. The history is provided by the patient.  Rash Location:  Shoulder/arm, leg and ano-genital Shoulder/arm rash location:  L arm and R arm Ano-genital rash location:  R buttock and L buttock Leg rash location:  L leg and R leg Quality: itchiness, redness and swelling   Severity:  Moderate Onset quality:  Gradual Duration:  2 days Timing:  Constant Progression:  Worsening Chronicity:  New Context: insect bite/sting   Relieved by:  None tried Worsened by:  Heat Ineffective treatments:  None tried  Rachael Collier is a 29 y.o. female who presents to the ED with a rash. She reports having been bit by mosquitoes on her arms, legs and buttock. She has multiple sites that are red and itching. There is one area on her left lower leg that has more redness and swelling and is painful.   Patient with hx of diabetes and without insurance at this time. Request blood sugar check because she is not taking any medications because when she lost her insurance she could not afford the medication. She reports that her diabetic neuropathy is returning and she has had episodes of floaters in her eyes for the past month. She is a patient of Dr. Margo AyeHall.   Past Medical History  Diagnosis Date  . Kidney stones   . Herpes     anal  . Polycystic ovarian disease   . Diabetes mellitus without complication   . Asthma   . Headache(784.0)   . Hx of chlamydia infection   . History of PCOS   . Depression 11/25/2012  . Dyslipidemia 12/31/2012  . Vaginal itching 03/02/2014  . Yeast infection 03/02/2014  . Kidney stones    Past Surgical History  Procedure Laterality Date  .  Inner ear surgery    . Arm hardware removal     Family History  Problem Relation Age of Onset  . Hypertension Mother   . Diabetes Mother   . Depression Mother   . Mental illness Mother   . Diabetes Father   . Heart attack Maternal Grandmother   . Heart disease Maternal Grandmother   . Diabetes Maternal Grandmother   . Diabetes Maternal Grandfather   . Diabetes Paternal Grandmother   . Diabetes Paternal Grandfather    History  Substance Use Topics  . Smoking status: Never Smoker   . Smokeless tobacco: Never Used  . Alcohol Use: Yes     Comment: occ   OB History    No data available     Review of Systems As stated in HPI   Allergies  Penicillins; Bee venom; Hydrocodone-acetaminophen; and Other  Home Medications   Prior to Admission medications   Medication Sig Start Date End Date Taking? Authorizing Provider  acetaminophen (TYLENOL) 500 MG tablet Take 2,000 mg by mouth every 6 (six) hours as needed (severe pain).     Historical Provider, MD  Cyanocobalamin (B-12 PO) Take 1 tablet by mouth daily.    Historical Provider, MD  hydrOXYzine (ATARAX/VISTARIL) 25 MG tablet Take 1 tablet (25 mg total) by mouth every 6 (six) hours. 02/05/15   Hope Orlene OchM Neese, NP  predniSONE (DELTASONE) 10 MG tablet Take 2 tablets (  20 mg total) by mouth 2 (two) times daily with a meal. 02/05/15   Hope Orlene Och, NP  sulfamethoxazole-trimethoprim (BACTRIM DS,SEPTRA DS) 800-160 MG per tablet Take 1 tablet by mouth 2 (two) times daily. 02/05/15 02/12/15  Hope Orlene Och, NP   BP 131/86 mmHg  Pulse 78  Temp(Src) 98.1 F (36.7 C) (Oral)  Resp 20  Ht  (1.854 m)  Wt 318 lb 14.4 oz (144.652 kg)  BMI 42.08 kg/m2  SpO2 98%  LMP 01/06/2015 Physical Exam  Constitutional: She is oriented to person, place, and time. She appears well-developed and well-nourished.  HENT:  Head: Normocephalic and atraumatic.  Eyes: Conjunctivae and EOM are normal.  Neck: Normal range of motion. Neck supple.  Cardiovascular:  Normal rate and regular rhythm.   Pulmonary/Chest: Effort normal. She has no wheezes. She has no rales.  Musculoskeletal: Normal range of motion.  Neurological: She is alert and oriented to person, place, and time. No cranial nerve deficit.  Skin: Skin is warm and dry. Rash noted.  Multiple red raised areas to the lower legs, arms, chest and buttocks. Areas are consistent with local reaction to insect bites. There is one area to the left calf that has increased redness and tender on palpation.   Psychiatric: She has a normal mood and affect. Her behavior is normal.  Nursing note and vitals reviewed.   ED Course  Procedures (including critical care time) Labs Review Labs Reviewed  CBG MONITORING, ED - Abnormal; Notable for the following:    Glucose-Capillary 131 (*)    All other components within normal limits     MDM  29 y.o. female with multiple area c/w insect bites with local reaction and one area to the left calf with erythema and early infection. Will treat with antibiotics. Encouraged patient to follow up with her PCP. She voices understanding and agrees with plan.   Final diagnoses:  Insect bites and stings, accidental or unintentional, initial encounter  Local reaction to insect sting, accidental or unintentional, initial encounter  Infected insect bite       [facility], NP 02/08/15 1844  Bethann Berkshire, MD 02/09/15 1214

## 2015-02-05 NOTE — Discharge Instructions (Signed)
Your appear to have local reaction to insect bites and one area that appears infected. Take the medication as directed. Follow up with Dr. Margo AyeHall for your diabetes. Call the social worker to discuss help with medications.

## 2015-02-05 NOTE — ED Notes (Addendum)
Pt states she has multiple bumps on her buttocks and other places on her body. Pt also states she does not have health insurance and is requesting for her blood sugar to be checked.

## 2015-03-24 ENCOUNTER — Encounter (HOSPITAL_COMMUNITY): Payer: Self-pay

## 2015-03-24 ENCOUNTER — Emergency Department (HOSPITAL_COMMUNITY): Payer: BLUE CROSS/BLUE SHIELD

## 2015-03-24 ENCOUNTER — Emergency Department (HOSPITAL_COMMUNITY)
Admission: EM | Admit: 2015-03-24 | Discharge: 2015-03-24 | Disposition: A | Discharge status: Home / Self Care | Payer: BLUE CROSS/BLUE SHIELD | Attending: Emergency Medicine | Admitting: Emergency Medicine

## 2015-03-24 DIAGNOSIS — J45909 Unspecified asthma, uncomplicated: Secondary | ICD-10-CM | POA: Diagnosis not present

## 2015-03-24 DIAGNOSIS — Z8619 Personal history of other infectious and parasitic diseases: Secondary | ICD-10-CM | POA: Diagnosis not present

## 2015-03-24 DIAGNOSIS — E119 Type 2 diabetes mellitus without complications: Secondary | ICD-10-CM | POA: Diagnosis not present

## 2015-03-24 DIAGNOSIS — Z79899 Other long term (current) drug therapy: Secondary | ICD-10-CM | POA: Insufficient documentation

## 2015-03-24 DIAGNOSIS — J029 Acute pharyngitis, unspecified: Secondary | ICD-10-CM | POA: Diagnosis not present

## 2015-03-24 DIAGNOSIS — Z872 Personal history of diseases of the skin and subcutaneous tissue: Secondary | ICD-10-CM | POA: Diagnosis not present

## 2015-03-24 DIAGNOSIS — R0602 Shortness of breath: Secondary | ICD-10-CM | POA: Diagnosis present

## 2015-03-24 DIAGNOSIS — Z8659 Personal history of other mental and behavioral disorders: Secondary | ICD-10-CM | POA: Insufficient documentation

## 2015-03-24 DIAGNOSIS — Z880 Allergy status to penicillin: Secondary | ICD-10-CM | POA: Diagnosis not present

## 2015-03-24 DIAGNOSIS — Z87442 Personal history of urinary calculi: Secondary | ICD-10-CM | POA: Insufficient documentation

## 2015-03-24 MED ORDER — DEXAMETHASONE 4 MG PO TABS
10.0000 mg | ORAL_TABLET | Freq: Once | ORAL | Status: AC
  Administered 2015-03-24: 10 mg via ORAL
  Filled 2015-03-24: qty 3

## 2015-03-24 MED ORDER — IBUPROFEN 800 MG PO TABS
800.0000 mg | ORAL_TABLET | Freq: Once | ORAL | Status: AC
  Administered 2015-03-24: 800 mg via ORAL
  Filled 2015-03-24: qty 1

## 2015-03-24 MED ORDER — ALBUTEROL SULFATE HFA 108 (90 BASE) MCG/ACT IN AERS
2.0000 | INHALATION_SPRAY | Freq: Four times a day (QID) | RESPIRATORY_TRACT | Status: DC
  Administered 2015-03-24: 2 via RESPIRATORY_TRACT
  Filled 2015-03-24: qty 6.7

## 2015-03-24 MED ORDER — ACETAMINOPHEN 500 MG PO TABS
1000.0000 mg | ORAL_TABLET | Freq: Once | ORAL | Status: AC
  Administered 2015-03-24: 1000 mg via ORAL
  Filled 2015-03-24: qty 2

## 2015-03-24 NOTE — Discharge Instructions (Signed)
Take 4 over the counter ibuprofen tablets 3 times a day or 2 over-the-counter naproxen tablets twice a day for pain.  Pharyngitis Pharyngitis is redness, pain, and swelling (inflammation) of your pharynx.  CAUSES  Pharyngitis is usually caused by infection. Most of the time, these infections are from viruses (viral) and are part of a cold. However, sometimes pharyngitis is caused by bacteria (bacterial). Pharyngitis can also be caused by allergies. Viral pharyngitis may be spread from person to person by coughing, sneezing, and personal items or utensils (cups, forks, spoons, toothbrushes). Bacterial pharyngitis may be spread from person to person by more intimate contact, such as kissing.  SIGNS AND SYMPTOMS  Symptoms of pharyngitis include:   Sore throat.   Tiredness (fatigue).   Low-grade fever.   Headache.  Joint pain and muscle aches.  Skin rashes.  Swollen lymph nodes.  Plaque-like film on throat or tonsils (often seen with bacterial pharyngitis). DIAGNOSIS  Your health care provider will ask you questions about your illness and your symptoms. Your medical history, along with a physical exam, is often all that is needed to diagnose pharyngitis. Sometimes, a rapid strep test is done. Other lab tests may also be done, depending on the suspected cause.  TREATMENT  Viral pharyngitis will usually get better in 3-4 days without the use of medicine. Bacterial pharyngitis is treated with medicines that kill germs (antibiotics).  HOME CARE INSTRUCTIONS   Drink enough water and fluids to keep your urine clear or pale yellow.   Only take over-the-counter or prescription medicines as directed by your health care provider:   If you are prescribed antibiotics, make sure you finish them even if you start to feel better.   Do not take aspirin.   Get lots of rest.   Gargle with 8 oz of salt water ( tsp of salt per 1 qt of water) as often as every 1-2 hours to soothe your  throat.   Throat lozenges (if you are not at risk for choking) or sprays may be used to soothe your throat. SEEK MEDICAL CARE IF:   You have large, tender lumps in your neck.  You have a rash.  You cough up green, yellow-brown, or bloody spit. SEEK IMMEDIATE MEDICAL CARE IF:   Your neck becomes stiff.  You drool or are unable to swallow liquids.  You vomit or are unable to keep medicines or liquids down.  You have severe pain that does not go away with the use of recommended medicines.  You have trouble breathing (not caused by a stuffy nose). MAKE SURE YOU:   Understand these instructions.  Will watch your condition.  Will get help right away if you are not doing well or get worse. Document Released: 07/24/2005 Document Revised: 05/14/2013 Document Reviewed: 03/31/2013 [facility] Patient Information 2015 [place], [place]. This information is not intended to replace advice given to you by your health care provider. Make sure you discuss any questions you have with your health care provider.

## 2015-03-24 NOTE — ED Notes (Signed)
Pt c/o difficulty breathing, SOB, productive yellow colored sputum with cough, and hoarseness x 2 weeks.

## 2015-03-24 NOTE — ED Notes (Signed)
Assumed care of patient from [place], [place]. Pt awaiting EDP disposition at this time. VSS. No distress.

## 2015-03-24 NOTE — ED Provider Notes (Signed)
CSN: [id number]     Arrival date & time 03/24/15  1737 History   First MD Initiated Contact with Patient 03/24/15 1746     Chief Complaint  Patient presents with  . Shortness of Breath     (Consider location/radiation/quality/duration/timing/severity/associated sxs/prior Treatment) Patient is a 29 y.o. female presenting with general illness. The history is provided by the patient.  Illness Severity:  Moderate Onset quality:  Gradual Duration:  2 weeks Timing:  Constant Progression:  Unchanged Chronicity:  New Associated symptoms: congestion, cough and fatigue   Associated symptoms: no chest pain, no fever, no headaches, no myalgias, no nausea, no rhinorrhea, no shortness of breath, no vomiting and no wheezing    29 yo F with a chief complaint of cough congestion and sore throat. This been going on for about 2 weeks. Patient now feels like she has some much drainage in the back of the throat that she's having trouble breathing. Patient denies any fevers or chills. Has been coughing up yellowish sputum. Denies vomiting abdominal pain rash.  Past Medical History  Diagnosis Date  . Kidney stones   . Herpes     anal  . Polycystic ovarian disease   . Diabetes mellitus without complication   . Asthma   . Headache(784.0)   . Hx of chlamydia infection   . History of PCOS   . Depression 11/25/2012  . Dyslipidemia 12/31/2012  . Vaginal itching 03/02/2014  . Yeast infection 03/02/2014  . Kidney stones    Past Surgical History  Procedure Laterality Date  . Inner ear surgery    . Arm hardware removal     Family History  Problem Relation Age of Onset  . Hypertension Mother   . Diabetes Mother   . Depression Mother   . Mental illness Mother   . Diabetes Father   . Heart attack Maternal Grandmother   . Heart disease Maternal Grandmother   . Diabetes Maternal Grandmother   . Diabetes Maternal Grandfather   . Diabetes Paternal Grandmother   . Diabetes Paternal Grandfather     Social History  Substance Use Topics  . Smoking status: Never Smoker   . Smokeless tobacco: Never Used  . Alcohol Use: Yes     Comment: occ   OB History    No data available     Review of Systems  Constitutional: Positive for fatigue. Negative for fever and chills.  HENT: Positive for congestion. Negative for rhinorrhea.   Eyes: Negative for redness and visual disturbance.  Respiratory: Positive for cough. Negative for shortness of breath and wheezing.   Cardiovascular: Negative for chest pain and palpitations.  Gastrointestinal: Negative for nausea and vomiting.  Genitourinary: Negative for dysuria and urgency.  Musculoskeletal: Negative for myalgias and arthralgias.  Skin: Negative for pallor and wound.  Neurological: Negative for dizziness and headaches.      Allergies  Penicillins; Bee venom; Hydrocodone-acetaminophen; and Other  Home Medications   Prior to Admission medications   Medication Sig Start Date End Date Taking? Authorizing Provider  acetaminophen (TYLENOL) 500 MG tablet Take 2,000 mg by mouth every 6 (six) hours as needed (severe pain).    Yes Historical Provider, MD  Cyanocobalamin (B-12 PO) Take 1 tablet by mouth daily.   Yes Historical Provider, MD  Menthol (COUGH DROPS) 10 MG LOZG Use as directed 1 each in the mouth or throat daily as needed (for sore throat).   Yes Historical Provider, MD  hydrOXYzine (ATARAX/VISTARIL) 25 MG tablet Take 1 tablet (25  mg total) by mouth every 6 (six) hours. Patient not taking: Reported on 03/24/2015 02/05/15   Janne Napoleon, NP  predniSONE (DELTASONE) 10 MG tablet Take 2 tablets (20 mg total) by mouth 2 (two) times daily with a meal. Patient not taking: Reported on 03/24/2015 02/05/15   Janne Napoleon, NP   BP 122/59 mmHg  Pulse 79  Temp(Src) 98.1 F (36.7 C) (Oral)  Resp 25  Ht 6\' 1"  (1.854 m)  Wt 315 lb (142.883 kg)  BMI 41.57 kg/m2  SpO2 95% Physical Exam  Constitutional: She is oriented to person, place, and time.  She appears well-developed and well-nourished. No distress.  HENT:  Head: Normocephalic and atraumatic.  Swollen nasal turbinates, postnasal drip, mild erythema to the posterior oropharynx handling secretions well able to rotate neck 45 in either direction.   Eyes: EOM are normal. Pupils are equal, round, and reactive to light.  Neck: Normal range of motion. Neck supple.  Cardiovascular: Normal rate and regular rhythm.  Exam reveals no gallop and no friction rub.   No murmur heard. Pulmonary/Chest: Effort normal. She has no wheezes. She has no rales.  Abdominal: Soft. She exhibits no distension. There is no tenderness. There is no rebound and no guarding.  Musculoskeletal: She exhibits no edema or tenderness.  Neurological: She is alert and oriented to person, place, and time.  Skin: Skin is warm and dry. She is not diaphoretic.  Psychiatric: She has a normal mood and affect. Her behavior is normal.    ED Course  Procedures (including critical care time) Labs Review Labs Reviewed - No data to display  Imaging Review Dg Chest 2 View  03/24/2015   CLINICAL DATA:  Shortness of breath and productive cough for 2 weeks.  EXAM: CHEST  2 VIEW  COMPARISON:  PA and lateral chest 04/11/2014.  FINDINGS: Heart size and mediastinal contours are within normal limits. Both lungs are clear. Visualized skeletal structures are unremarkable.  IMPRESSION: Negative exam.   Electronically Signed   By: Drusilla Kanner M.D.   On: 03/24/2015 18:58   I have personally reviewed and evaluated these images and lab results as part of my medical decision-making.   EKG Interpretation None      MDM   Final diagnoses:  Viral pharyngitis    28 yo F with a chief complaint of cough congestion and sore throat. Patient with some mild fullness below the jaw bilaterally. Able to rotate head back and forth 45 without difficulty. Feel that Ludwig angina is unlikely. Mild erythematous posterior oropharynx. Will treat  with Decadron Motrin Tylenol. Chest x-ray due to length of symptoms and continued cough.  Chest xray negative as viewed by me.   We'll treat as viral pharyngitis..   I have discussed the diagnosis/risks/treatment options with the patient and believe the pt to be eligible for discharge home to follow-up with PCP. We also discussed returning to the ED immediately if new or worsening sx occur. We discussed the sx which are most concerning (e.g., inability to swallow, worsening neck pain) that necessitate immediate return. Medications administered to the patient during their visit and any new prescriptions provided to the patient are listed below.  Medications given during this visit Medications  albuterol (PROVENTIL HFA;VENTOLIN HFA) 108 (90 BASE) MCG/ACT inhaler 2 puff (2 puffs Inhalation Given 03/24/15 1955)  dexamethasone (DECADRON) tablet 10 mg (10 mg Oral Given 03/24/15 1833)  ibuprofen (ADVIL,MOTRIN) tablet 800 mg (800 mg Oral Given 03/24/15 1834)  acetaminophen (TYLENOL) tablet 1,000  mg (1,000 mg Oral Given 03/24/15 1834)    Discharge Medication List as of 03/24/2015  7:05 PM       The patient appears reasonably screen and/or stabilized for discharge and I doubt any other medical condition or other [facility] requiring further screening, evaluation, or treatment in the ED at this time prior to discharge.    Melene Plan, DO 03/24/15 2216

## 2015-03-24 NOTE — ED Notes (Signed)
MD at bedside. 

## 2015-03-24 NOTE — ED Notes (Signed)
Pt reports productive cough with yellow sputum, laryngitis, and difficulty breathing x 2 weeks.

## 2015-03-26 ENCOUNTER — Telehealth: Payer: Self-pay | Admitting: *Deleted

## 2015-03-26 ENCOUNTER — Other Ambulatory Visit: Payer: Self-pay | Admitting: Advanced Practice Midwife

## 2015-03-26 NOTE — Telephone Encounter (Signed)
Spoke with pt. Pt thinks she has a yeast infection. Offered appt today but pt unable to come. I advised to use OTC meds over the weekend and schedule an appt for next week. If pt's symptoms get better, can cancel appt for next week. Pt voiced understanding. JSY
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
## 2015-03-30 ENCOUNTER — Ambulatory Visit: Payer: Self-pay | Admitting: Women's Health

## 2015-04-05 ENCOUNTER — Encounter: Payer: Self-pay | Admitting: *Deleted

## 2015-04-05 ENCOUNTER — Other Ambulatory Visit: Payer: Self-pay | Admitting: Adult Health

## 2015-04-07 ENCOUNTER — Encounter (HOSPITAL_COMMUNITY): Payer: Self-pay | Admitting: Family Medicine

## 2015-04-07 ENCOUNTER — Emergency Department (HOSPITAL_COMMUNITY): Payer: BLUE CROSS/BLUE SHIELD

## 2015-04-07 ENCOUNTER — Emergency Department (HOSPITAL_COMMUNITY)
Admission: EM | Admit: 2015-04-07 | Discharge: 2015-04-07 | Disposition: A | Discharge status: Home / Self Care | Payer: BLUE CROSS/BLUE SHIELD | Attending: Emergency Medicine | Admitting: Emergency Medicine

## 2015-04-07 DIAGNOSIS — F329 Major depressive disorder, single episode, unspecified: Secondary | ICD-10-CM | POA: Diagnosis not present

## 2015-04-07 DIAGNOSIS — Z79899 Other long term (current) drug therapy: Secondary | ICD-10-CM | POA: Diagnosis not present

## 2015-04-07 DIAGNOSIS — N12 Tubulo-interstitial nephritis, not specified as acute or chronic: Secondary | ICD-10-CM | POA: Diagnosis not present

## 2015-04-07 DIAGNOSIS — Z8619 Personal history of other infectious and parasitic diseases: Secondary | ICD-10-CM | POA: Insufficient documentation

## 2015-04-07 DIAGNOSIS — Z87442 Personal history of urinary calculi: Secondary | ICD-10-CM | POA: Insufficient documentation

## 2015-04-07 DIAGNOSIS — J45909 Unspecified asthma, uncomplicated: Secondary | ICD-10-CM | POA: Diagnosis not present

## 2015-04-07 DIAGNOSIS — Z3202 Encounter for pregnancy test, result negative: Secondary | ICD-10-CM | POA: Insufficient documentation

## 2015-04-07 DIAGNOSIS — E119 Type 2 diabetes mellitus without complications: Secondary | ICD-10-CM | POA: Diagnosis not present

## 2015-04-07 DIAGNOSIS — Z8742 Personal history of other diseases of the female genital tract: Secondary | ICD-10-CM | POA: Insufficient documentation

## 2015-04-07 DIAGNOSIS — Z880 Allergy status to penicillin: Secondary | ICD-10-CM | POA: Insufficient documentation

## 2015-04-07 DIAGNOSIS — R109 Unspecified abdominal pain: Secondary | ICD-10-CM | POA: Diagnosis present

## 2015-04-07 LAB — CBC WITH DIFFERENTIAL/PLATELET
BASOS PCT: 1 % (ref 0–1)
Basophils Absolute: 0 10*3/uL (ref 0.0–0.1)
EOS ABS: 0.1 10*3/uL (ref 0.0–0.7)
Eosinophils Relative: 1 % (ref 0–5)
HCT: 40.6 % (ref 36.0–46.0)
Hemoglobin: 14 g/dL (ref 12.0–15.0)
Lymphocytes Relative: 45 % (ref 12–46)
Lymphs Abs: 3.6 10*3/uL (ref 0.7–4.0)
MCH: 29.5 pg (ref 26.0–34.0)
MCHC: 34.5 g/dL (ref 30.0–36.0)
MCV: 85.7 fL (ref 78.0–100.0)
Monocytes Absolute: 0.5 10*3/uL (ref 0.1–1.0)
Monocytes Relative: 6 % (ref 3–12)
Neutro Abs: 3.7 10*3/uL (ref 1.7–7.7)
Neutrophils Relative %: 47 % (ref 43–77)
PLATELETS: 255 10*3/uL (ref 150–400)
RBC: 4.74 MIL/uL (ref 3.87–5.11)
RDW: 13 % (ref 11.5–15.5)
WBC: 8 10*3/uL (ref 4.0–10.5)

## 2015-04-07 LAB — BASIC METABOLIC PANEL
Anion gap: 9 (ref 5–15)
BUN: 13 mg/dL (ref 6–20)
CALCIUM: 9.5 mg/dL (ref 8.9–10.3)
CO2: 22 mmol/L (ref 22–32)
CREATININE: 0.65 mg/dL (ref 0.44–1.00)
Chloride: 106 mmol/L (ref 101–111)
GFR calc non Af Amer: >60 mL/min (ref >60)
Glucose, Bld: 118 mg/dL — ABNORMAL HIGH (ref 65–99)
Potassium: 4 mmol/L (ref 3.5–5.1)
Sodium: 137 mmol/L (ref 135–145)

## 2015-04-07 LAB — URINE MICROSCOPIC-ADD ON

## 2015-04-07 LAB — URINALYSIS, ROUTINE W REFLEX MICROSCOPIC
BILIRUBIN URINE: NEGATIVE
Glucose, UA: NEGATIVE mg/dL
HGB URINE DIPSTICK: NEGATIVE
KETONES UR: NEGATIVE mg/dL
Nitrite: NEGATIVE
PROTEIN: NEGATIVE mg/dL
Specific Gravity, Urine: 1.026 (ref 1.005–1.030)
UROBILINOGEN UA: 0.2 mg/dL (ref 0.0–1.0)
pH: 5.5 (ref 5.0–8.0)

## 2015-04-07 LAB — POC URINE PREG, ED: Preg Test, Ur: NEGATIVE

## 2015-04-07 MED ORDER — LEVOFLOXACIN 750 MG PO TABS: 750.0000 mg | ORAL_TABLET | Freq: Every day | ORAL | Status: DC

## 2015-04-07 MED ORDER — KETOROLAC TROMETHAMINE 30 MG/ML IJ SOLN
30.0000 mg | Freq: Once | INTRAMUSCULAR | Status: AC
  Administered 2015-04-07: 30 mg via INTRAVENOUS
  Filled 2015-04-07: qty 1

## 2015-04-07 MED ORDER — TRAMADOL HCL 50 MG PO TABS: 50.0000 mg | ORAL_TABLET | Freq: Four times a day (QID) | ORAL | Status: DC | PRN

## 2015-04-07 MED ORDER — ONDANSETRON 4 MG PO TBDP: 4.0000 mg | ORAL_TABLET | Freq: Three times a day (TID) | ORAL | Status: DC | PRN

## 2015-04-07 MED ORDER — LEVOFLOXACIN IN D5W 500 MG/100ML IV SOLN
500.0000 mg | Freq: Once | INTRAVENOUS | Status: AC
  Administered 2015-04-07: 500 mg via INTRAVENOUS
  Filled 2015-04-07: qty 100

## 2015-04-07 MED ORDER — SODIUM CHLORIDE 0.9 % IV BOLUS (SEPSIS)
1000.0000 mL | Freq: Once | INTRAVENOUS | Status: AC
  Administered 2015-04-07: 1000 mL via INTRAVENOUS

## 2015-04-07 NOTE — ED Provider Notes (Signed)
CSN: [id number]     Arrival date & time 04/07/15  1016 History   First MD Initiated Contact with Patient 04/07/15 1021     Chief Complaint  Patient presents with  . Breast Pain  . Flank Pain     (Consider location/radiation/quality/duration/timing/severity/associated sxs/prior Treatment) HPI Comments: Pt presents with c/o left breast x1 day and left flank pain x2 hours. Pt reports left breast hurts with left arm movement, or leaning forward. Last menses was last week. Pt states her flank pain began approx 2 hours ago, and states pain feels like previous kidney stones.  Patient is a 29 y.o. female presenting with flank pain and abdominal pain.  Flank Pain Associated symptoms include abdominal pain, chest pain (left lateral), congestion, coughing and nausea. Pertinent negatives include no vomiting.  Abdominal Pain Pain location:  L flank Pain quality: sharp   Pain radiates to:  Chest Pain severity:  Severe Duration:  1 day Timing:  Intermittent Progression:  Waxing and waning Relieved by:  Nothing Worsened by:  Movement Ineffective treatments:  OTC medications Associated symptoms: chest pain (left lateral), cough and nausea   Associated symptoms: no diarrhea, no dysuria, no hematuria, no shortness of breath, no vaginal bleeding, no vaginal discharge and no vomiting     Past Medical History  Diagnosis Date  . Kidney stones   . Herpes     anal  . Polycystic ovarian disease   . Asthma   . Headache(784.0)   . Hx of chlamydia infection   . History of PCOS   . Depression 11/25/2012  . Dyslipidemia 12/31/2012  . Vaginal itching 03/02/2014  . Yeast infection 03/02/2014  . Kidney stones   . Diabetes mellitus without complication    Past Surgical History  Procedure Laterality Date  . Inner ear surgery    . Arm hardware removal    . Orif wrist fracture     Family History  Problem Relation Age of Onset  . Hypertension Mother   . Diabetes Mother   . Depression Mother   .  Mental illness Mother   . Diabetes Father   . Heart attack Maternal Grandmother   . Heart disease Maternal Grandmother   . Diabetes Maternal Grandmother   . Diabetes Maternal Grandfather   . Diabetes Paternal Grandmother   . Diabetes Paternal Grandfather    Social History  Substance Use Topics  . Smoking status: Never Smoker   . Smokeless tobacco: Never Used  . Alcohol Use: Yes     Comment: occ   OB History    No data available     Review of Systems  HENT: Positive for congestion and postnasal drip.   Respiratory: Positive for cough. Negative for shortness of breath.   Cardiovascular: Positive for chest pain (left lateral).  Gastrointestinal: Positive for nausea and abdominal pain. Negative for vomiting and diarrhea.  Genitourinary: Positive for frequency, flank pain and decreased urine volume. Negative for dysuria, hematuria, vaginal bleeding and vaginal discharge.  All other systems reviewed and are negative.     Allergies  Penicillins; Bee venom; Hydrocodone-acetaminophen; and Other  Home Medications   Prior to Admission medications   Medication Sig Start Date End Date Taking? Authorizing Provider  acetaminophen (TYLENOL) 500 MG tablet Take 2,000 mg by mouth every 6 (six) hours as needed (severe pain).    Yes Historical Provider, MD  albuterol (PROVENTIL HFA;VENTOLIN HFA) 108 (90 BASE) MCG/ACT inhaler Inhale 2 puffs into the lungs every 6 (six) hours as needed for wheezing  or shortness of breath.   Yes Historical Provider, MD  Cyanocobalamin (B-12 PO) Take 1 tablet by mouth daily.   Yes Historical Provider, MD  fluconazole (DIFLUCAN) 150 MG tablet take 1 tablet by mouth immediately REPEAT IN 3 DAYS 03/29/15   Jacklyn Shell, CNM  hydrOXYzine (ATARAX/VISTARIL) 25 MG tablet Take 1 tablet (25 mg total) by mouth every 6 (six) hours. Patient not taking: Reported on 03/24/2015 02/05/15   Janne Napoleon, NP  levofloxacin (LEVAQUIN) 750 MG tablet Take 1 tablet (750 mg  total) by mouth daily. 04/07/15   Delyle Weider, PA-C  ondansetron (ZOFRAN ODT) 4 MG disintegrating tablet Take 1 tablet (4 mg total) by mouth every 8 (eight) hours as needed for nausea or vomiting. 04/07/15   Francee Piccolo, PA-C  predniSONE (DELTASONE) 10 MG tablet Take 2 tablets (20 mg total) by mouth 2 (two) times daily with a meal. Patient not taking: Reported on 03/24/2015 02/05/15   Janne Napoleon, NP  traMADol (ULTRAM) 50 MG tablet Take 1 tablet (50 mg total) by mouth every 6 (six) hours as needed. 04/07/15   Baltazar Pekala, PA-C   BP 107/60 mmHg  Pulse 73  Temp(Src) 98.3 F (36.8 C) (Oral)  Resp 18  SpO2 93%  LMP 03/26/2015 (Exact Date) Physical Exam  Constitutional: She is oriented to person, place, and time. She appears well-developed and well-nourished. No distress.  HENT:  Head: Normocephalic and atraumatic.  Eyes: Conjunctivae are normal.  Neck: Neck supple.  Cardiovascular: Normal rate, regular rhythm and normal heart sounds.   Pulmonary/Chest: Effort normal and breath sounds normal. She exhibits tenderness. Left breast exhibits no inverted nipple, no mass, no nipple discharge, no skin change and no tenderness.    Abdominal: Soft. There is no tenderness. There is CVA tenderness (L). There is no rigidity.  Musculoskeletal: She exhibits no edema.  Neurological: She is alert and oriented to person, place, and time.  Skin: Skin is warm and dry. She is not diaphoretic.  Nursing note and vitals reviewed.   ED Course  Procedures (including critical care time) Medications  sodium chloride 0.9 % bolus 1,000 mL (0 mLs Intravenous Stopped 04/07/15 1400)  ketorolac (TORADOL) 30 MG/ML injection 30 mg (30 mg Intravenous Given 04/07/15 1221)  levofloxacin (LEVAQUIN) IVPB 500 mg (0 mg Intravenous Stopped 04/07/15 1436)    Labs Review Labs Reviewed  BASIC METABOLIC PANEL - Abnormal; Notable for the following:    Glucose, Bld 118 (*)    All other components within  normal limits  URINALYSIS, ROUTINE W REFLEX MICROSCOPIC (NOT AT [facility]) - Abnormal; Notable for the following:    APPearance TURBID (*)    Leukocytes, UA MODERATE (*)    All other components within normal limits  URINE MICROSCOPIC-ADD ON - Abnormal; Notable for the following:    Squamous Epithelial / LPF MANY (*)    Bacteria, UA FEW (*)    All other components within normal limits  URINE CULTURE  CBC WITH DIFFERENTIAL/PLATELET  POC URINE PREG, ED    Imaging Review Dg Chest 2 View  04/07/2015   CLINICAL DATA:  One month history of productive cough. One day history of chest pain  EXAM: CHEST  2 VIEW  COMPARISON:  March 24, 2015  FINDINGS: Lungs are clear. Heart size and pulmonary vascularity are normal. No adenopathy. No pneumothorax. No bone lesions.  IMPRESSION: No abnormality noted.   Electronically Signed   By: Bretta Bang III M.D.   On: 04/07/2015 12:16   I have  personally reviewed and evaluated these images and lab results as part of my medical decision-making.   EKG Interpretation None      MDM   Final diagnoses:  Pyelonephritis    Filed Vitals:   04/07/15 1347  BP: 107/60  Pulse: 73  Temp:   Resp: 18   Afebrile, NAD, non-toxic appearing, AAOx4.   Left sided flank and left lateral chest wall pain. L CVA tenderness noted. Abdomen soft, non-tender, non-distended. No peritoneal signs. Lungs clear to auscultation bilaterally. CXR reviewed. UA consistent with infection, no evidence to suggest kidney stone. Will treat with Abx for pyelo. Patient is afebrile, hemodynamically stable without intractable nausea or vomiting. Will d/c home on Abx. Return precautions discussed. Advised PCP f/u. Parent agreeable to plan.  Patient is stable at time of discharge     Francee Piccolo, PA-C 04/07/15 1553  Benjiman Core, MD 04/08/15 [phone]

## 2015-04-07 NOTE — ED Notes (Signed)
Pt aware of urine sample needed, Pt ambulated to the bathroom with ease.

## 2015-04-07 NOTE — Discharge Instructions (Signed)
Please follow up with your primary care physician in 1-2 days. If you do not have one please call the [facility] number listed above. Please take pain medication and/or muscle relaxants as prescribed and as needed for pain. Please do not drive on narcotic pain medication or on muscle relaxants. Please take your antibiotic until completion. Please read all discharge instructions and return precautions.  ° ° °Pyelonephritis, Adult °Pyelonephritis is a kidney infection. In general, there are 2 main types of pyelonephritis: °· Infections that come on quickly without any warning (acute pyelonephritis). °· Infections that persist for a long period of time (chronic pyelonephritis). °CAUSES  °Two main causes of pyelonephritis are: °· Bacteria traveling from the bladder to the kidney. This is a problem especially in pregnant women. The urine in the bladder can become filled with bacteria from multiple causes, including: °¨ Inflammation of the prostate gland (prostatitis). °¨ Sexual intercourse in females. °¨ Bladder infection (cystitis). °· Bacteria traveling from the bloodstream to the tissue part of the kidney. °Problems that may increase your risk of getting a kidney infection include: °· Diabetes. °· Kidney stones or bladder stones. °· Cancer. °· Catheters placed in the bladder. °· Other abnormalities of the kidney or ureter. °SYMPTOMS  °· Abdominal pain. °· Pain in the side or flank area. °· Fever. °· Chills. °· Upset stomach. °· Blood in the urine (dark urine). °· Frequent urination. °· Strong or persistent urge to urinate. °· Burning or stinging when urinating. °DIAGNOSIS  °Your caregiver may diagnose your kidney infection based on your symptoms. A urine sample may also be taken. °TREATMENT  °In general, treatment depends on how severe the infection is.  °· If the infection is mild and caught early, your caregiver may treat you with oral antibiotics and send you home. °· If the infection is more  severe, the bacteria may have gotten into the bloodstream. This will require intravenous (IV) antibiotics and a hospital stay. Symptoms may include: °¨ High fever. °¨ Severe flank pain. °¨ Shaking chills. °· Even after a hospital stay, your caregiver may require you to be on oral antibiotics for a period of time. °· Other treatments may be required depending upon the cause of the infection. °HOME CARE INSTRUCTIONS  °· Take your antibiotics as directed. Finish them even if you start to feel better. °· Make an appointment to have your urine checked to make sure the infection is gone. °· Drink enough fluids to keep your urine clear or pale yellow. °· Take medicines for the bladder if you have urgency and frequency of urination as directed by your caregiver. °SEEK IMMEDIATE MEDICAL CARE IF:  °· You have a fever or persistent symptoms for more than 2-3 days. °· You have a fever and your symptoms suddenly get worse. °· You are unable to take your antibiotics or fluids. °· You develop shaking chills. °· You experience extreme weakness or fainting. °· There is no improvement after 2 days of treatment. °MAKE SURE YOU: °· Understand these instructions. °· Will watch your condition. °· Will get help right away if you are not doing well or get worse. °Document Released: 07/24/2005 Document Revised: 01/23/2012 Document Reviewed: 12/28/2010 °[employer]® Patient Information ©2015 [employer]. This information is not intended to replace advice given to you by your health care provider. Make sure you discuss any questions you have with your health care provider. ° ° ° ° °

## 2015-04-07 NOTE — ED Notes (Signed)
Pt comfortable with discharge and follow up instructions. Prescriptions x3. 

## 2015-04-07 NOTE — ED Notes (Signed)
Patient transported to X-ray 

## 2015-04-07 NOTE — ED Notes (Signed)
Pt presents with c/o left breast x1 day and left flank pain x2 hours.  Pt reports left breast hurts with left arm movement, or leaning forward. Last menses was last week. Pt states her flank pain began approx 2 hours ago, and states pain feels like previous kidney stones.

## 2015-04-07 NOTE — ED Notes (Signed)
Patient has remaining in antibiotic, will d/c after.

## 2015-04-08 LAB — URINE CULTURE

## 2015-09-19 IMAGING — US US PELVIS COMPLETE
1 series · 13 of 25 positions shown · non-contrast
Comparison: None.

CLINICAL DATA: Pelvic pain. Clinical suspicion for ovarian torsion.

EXAM:
TRANSABDOMINAL AND TRANSVAGINAL ULTRASOUND OF PELVIS
DOPPLER ULTRASOUND OF OVARIES
TECHNIQUE: Both transabdominal and transvaginal ultrasound examinations of the
pelvis were performed. Transabdominal technique was performed for
global imaging of the pelvis including uterus, ovaries, adnexal
regions, and pelvic cul-de-sac.
It was necessary to proceed with endovaginal exam following the
transabdominal exam to visualize the endometrium and ovaries. Color
and duplex Doppler ultrasound was utilized to evaluate blood flow to
the ovaries.

[Series 1: us pelvis complete · 0.23mm/px · 13 of 82 slices shown]
[im 1/82]
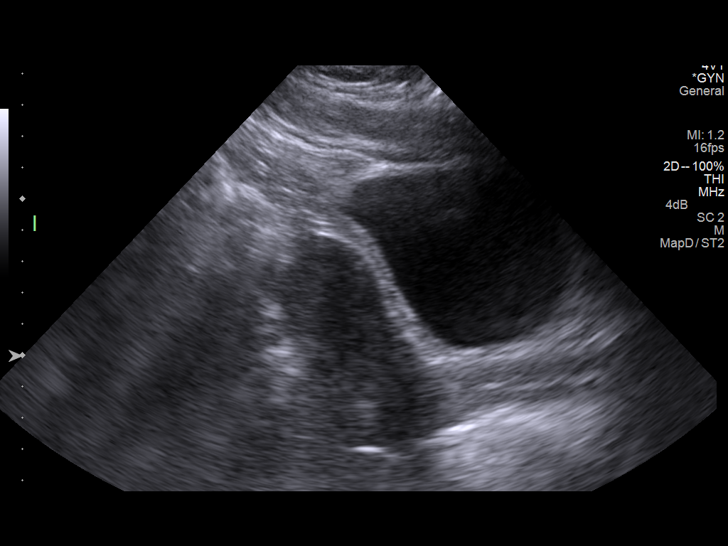
[im 7/82]
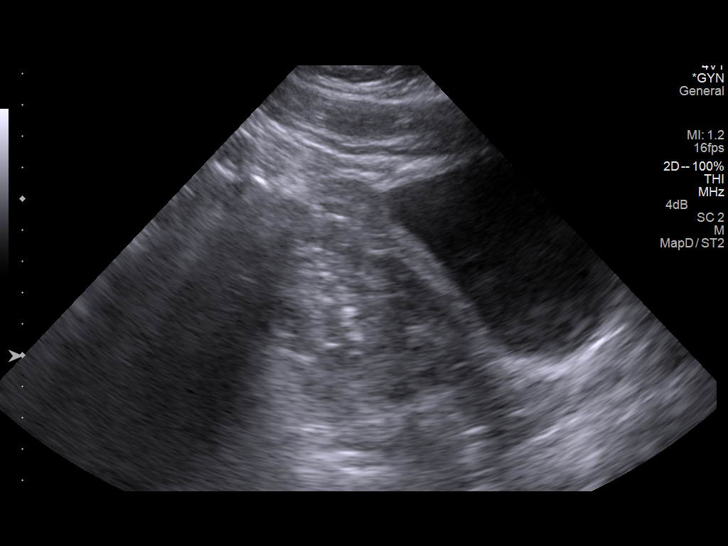
[im 14/82]
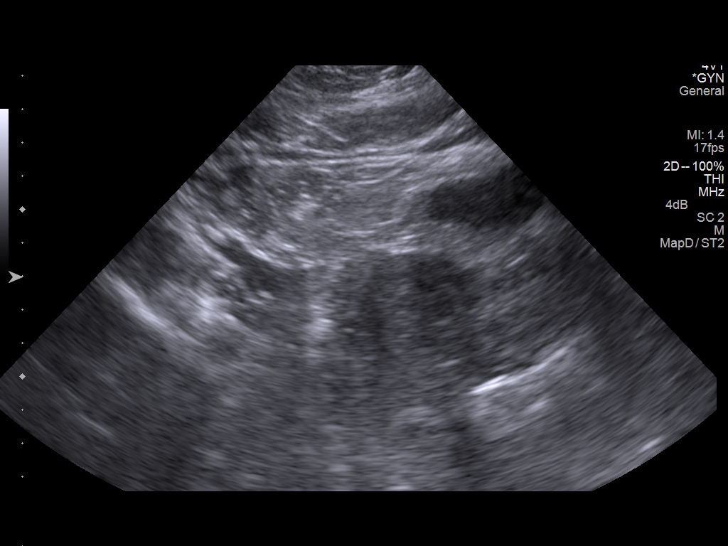
[im 21/82]
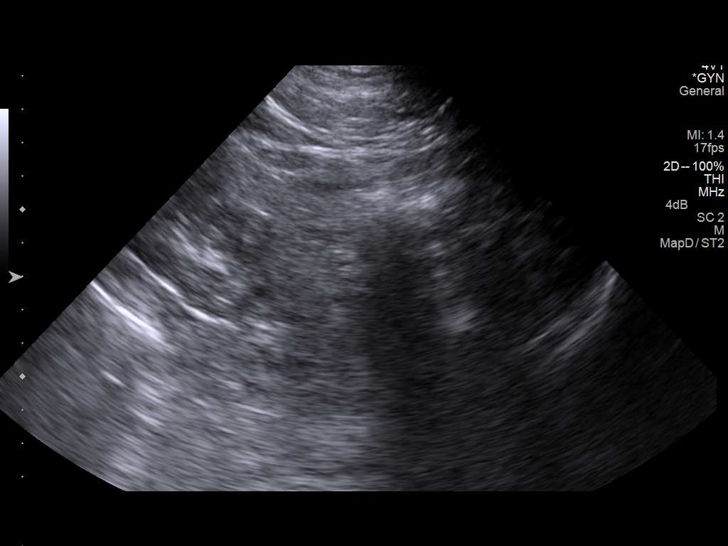
[im 28/82]
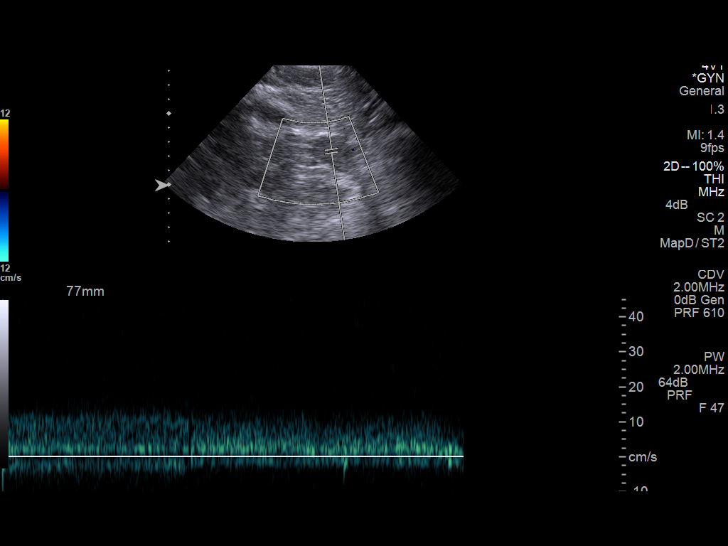
[im 34/82]
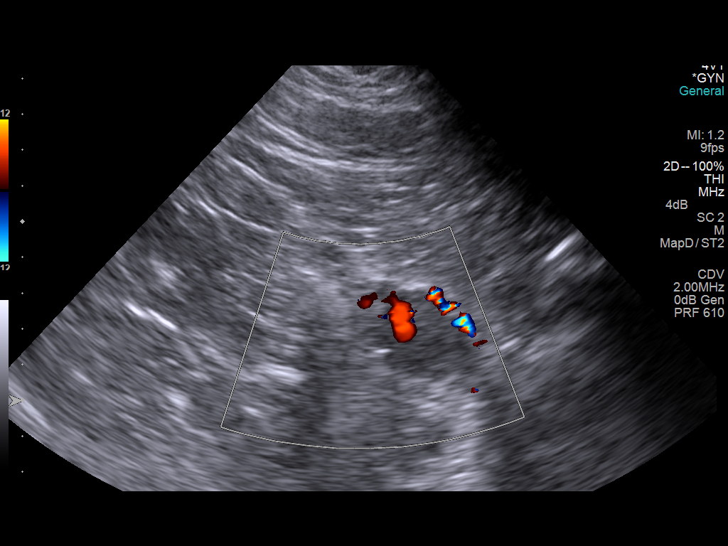
[im 41/82]
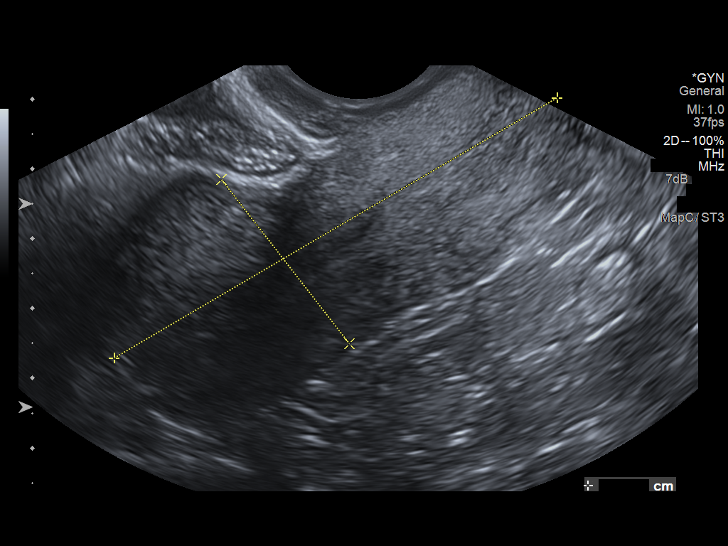
[im 48/82]
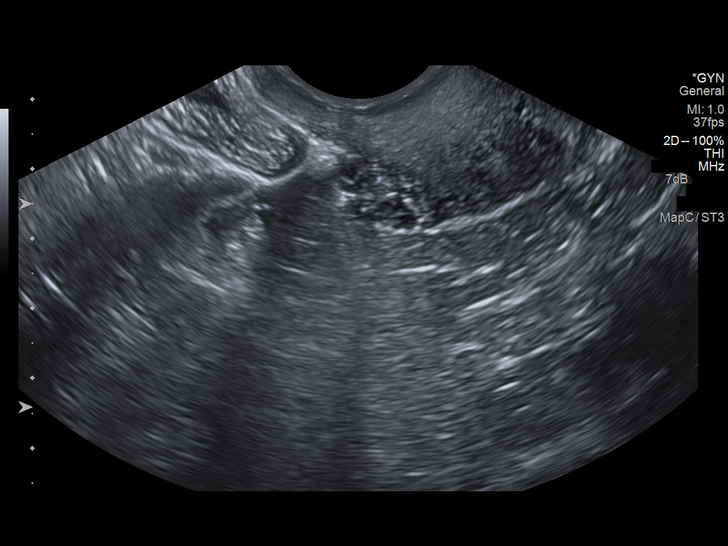
[im 55/82]
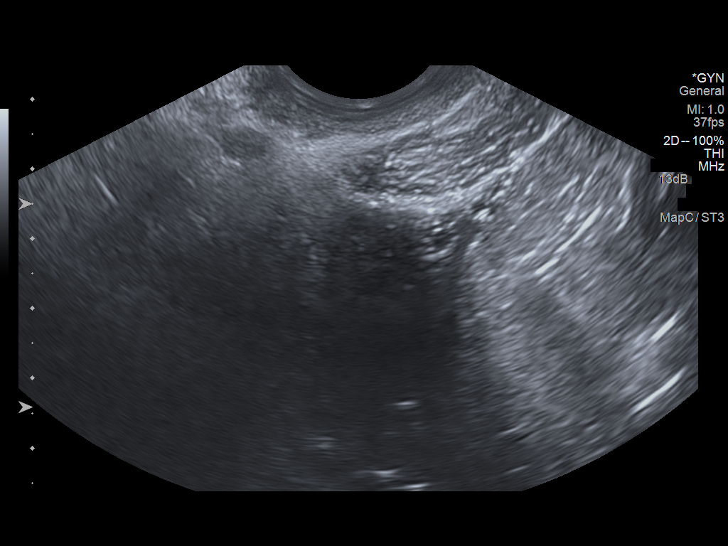
[im 61/82]
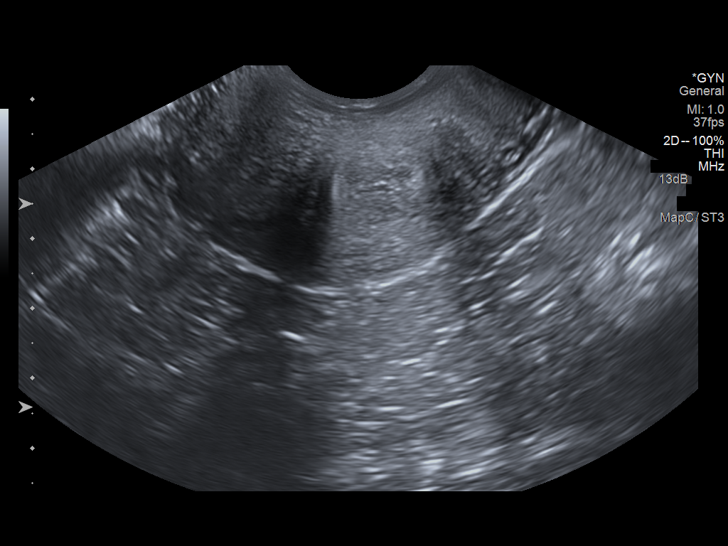
[im 68/82]
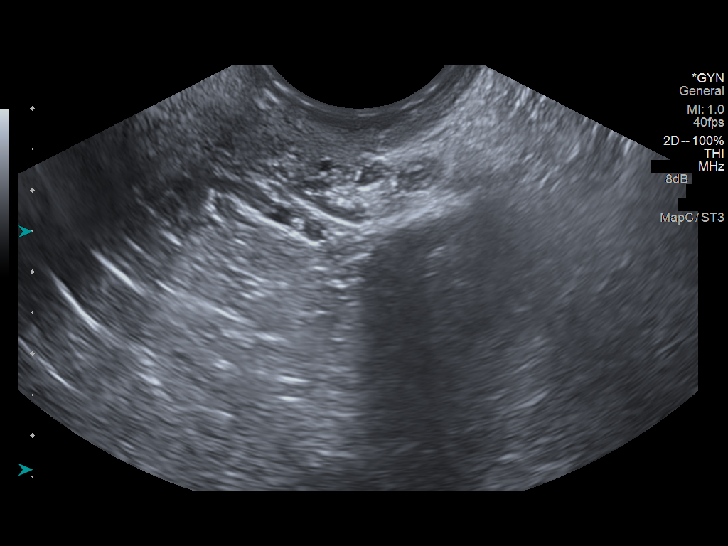
[im 75/82]
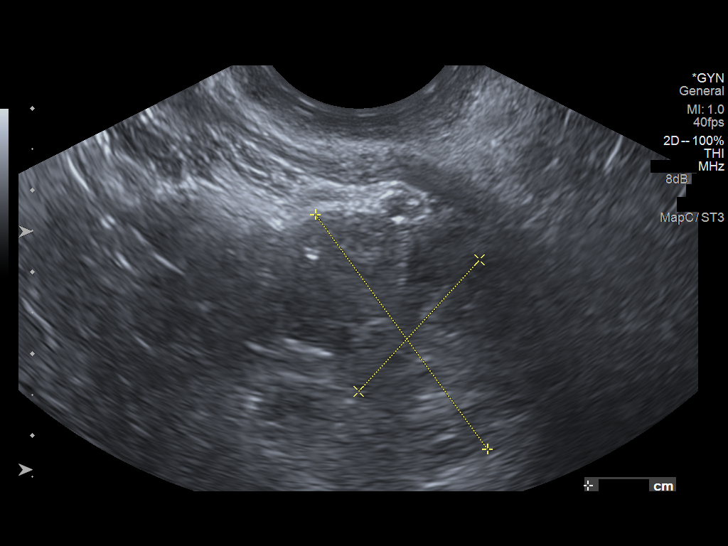
[im 82/82]
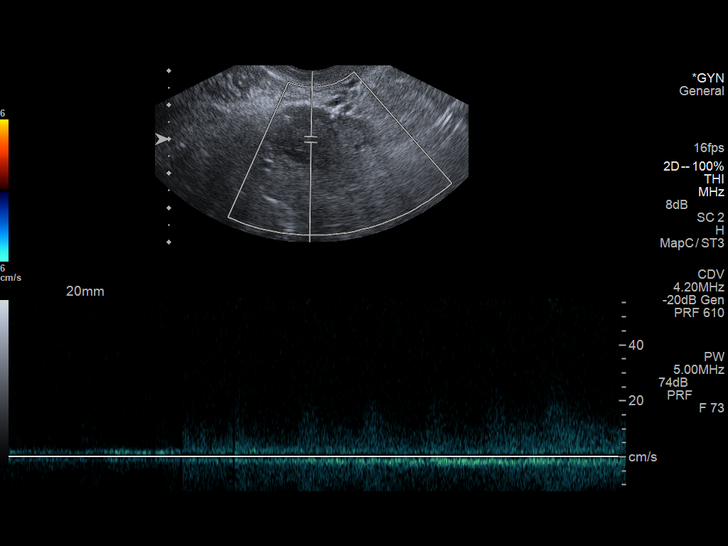

[13 of 25 positions shown; findings below may reference images not displayed]

FINDINGS: Uterus

Measurements: 7.4 x 3.0 x 3.8 cm. No fibroids or other mass
visualized.

Endometrium

Thickness: 3 mm  No focal abnormality visualized.

Right ovary

Measurements: Not directly visualized by transabdominal or
transvaginal sonography, however no adnexal mass identified.

Left ovary

Measurements: 3.6 x 2.2 x 2.1 cm. Normal appearance/no adnexal mass.

Pulsed Doppler evaluation of the left ovary demonstrates normal
low-resistance arterial and venous waveforms. The right ovary could
not be evaluated by Doppler as it was not visualized
sonographically.
IMPRESSION: Normal appearance of uterus and left ovary. No sonographic evidence
of left ovarian torsion.

Nonvisualization of right ovary, however no right adnexal mass
identified.

## 2015-10-15 ENCOUNTER — Encounter (HOSPITAL_COMMUNITY): Payer: Self-pay | Admitting: Emergency Medicine

## 2015-10-15 ENCOUNTER — Emergency Department (HOSPITAL_COMMUNITY)
Admission: EM | Admit: 2015-10-15 | Discharge: 2015-10-15 | Disposition: A | Discharge status: Home / Self Care | Payer: BLUE CROSS/BLUE SHIELD | Attending: Emergency Medicine | Admitting: Emergency Medicine

## 2015-10-15 ENCOUNTER — Emergency Department (HOSPITAL_COMMUNITY): Payer: BLUE CROSS/BLUE SHIELD

## 2015-10-15 DIAGNOSIS — Y929 Unspecified place or not applicable: Secondary | ICD-10-CM | POA: Insufficient documentation

## 2015-10-15 DIAGNOSIS — X501XXA Overexertion from prolonged static or awkward postures, initial encounter: Secondary | ICD-10-CM | POA: Diagnosis not present

## 2015-10-15 DIAGNOSIS — Y999 Unspecified external cause status: Secondary | ICD-10-CM | POA: Insufficient documentation

## 2015-10-15 DIAGNOSIS — E119 Type 2 diabetes mellitus without complications: Secondary | ICD-10-CM | POA: Insufficient documentation

## 2015-10-15 DIAGNOSIS — S39012D Strain of muscle, fascia and tendon of lower back, subsequent encounter: Secondary | ICD-10-CM

## 2015-10-15 DIAGNOSIS — F329 Major depressive disorder, single episode, unspecified: Secondary | ICD-10-CM | POA: Insufficient documentation

## 2015-10-15 DIAGNOSIS — E785 Hyperlipidemia, unspecified: Secondary | ICD-10-CM | POA: Insufficient documentation

## 2015-10-15 DIAGNOSIS — S3992XA Unspecified injury of lower back, initial encounter: Secondary | ICD-10-CM | POA: Diagnosis present

## 2015-10-15 DIAGNOSIS — Y9389 Activity, other specified: Secondary | ICD-10-CM | POA: Diagnosis not present

## 2015-10-15 DIAGNOSIS — Z79899 Other long term (current) drug therapy: Secondary | ICD-10-CM | POA: Diagnosis not present

## 2015-10-15 DIAGNOSIS — S39012A Strain of muscle, fascia and tendon of lower back, initial encounter: Secondary | ICD-10-CM | POA: Diagnosis not present

## 2015-10-15 DIAGNOSIS — J45909 Unspecified asthma, uncomplicated: Secondary | ICD-10-CM | POA: Diagnosis not present

## 2015-10-15 LAB — POC URINE PREG, ED: PREG TEST UR: NEGATIVE

## 2015-10-15 MED ORDER — METHOCARBAMOL 500 MG PO TABS: 1000.0000 mg | ORAL_TABLET | Freq: Four times a day (QID) | ORAL | Status: AC

## 2015-10-15 MED ORDER — METHOCARBAMOL 500 MG PO TABS
1000.0000 mg | ORAL_TABLET | Freq: Once | ORAL | Status: AC
  Administered 2015-10-15: 1000 mg via ORAL
  Filled 2015-10-15: qty 2

## 2015-10-15 NOTE — ED Notes (Signed)
PT stated lower back pain radiating down right leg since 10/11/15 after picking up her nephew up. PT [facility] and no relief from Percocet and developed an allergy to flexeril that was perscribed.

## 2015-10-17 NOTE — ED Provider Notes (Signed)
CSN: [id number]     Arrival date & time 10/15/15  1205 History   First MD Initiated Contact with Patient 10/15/15 1448     Chief Complaint  Patient presents with  . Back Pain     (Consider location/radiation/quality/duration/timing/severity/associated sxs/prior Treatment) Patient is a 30 y.o. female presenting with back pain. The history is provided by the patient.  Back Pain Location:  Lumbar spine Quality:  Aching and burning Radiates to:  R thigh Pain severity:  Moderate Pain is:  Same all the time Onset quality:  Sudden (occurred when she picked up her toddler nephew 4 days ago) Timing:  Constant Progression:  Unchanged Chronicity:  New Context: lifting heavy objects   Relieved by:  Nothing Worsened by:  Movement Ineffective treatments:  Narcotics and muscle relaxants (was seen [facility] 3 days ago, prescribed oxycodone which has not relieved her pain and fexeril which she had to stop taking as it was making her itch) Associated symptoms: no abdominal pain, no bowel incontinence, no chest pain, no dysuria, no fever, no numbness, no paresthesias, no perianal numbness, no tingling and no weakness     Past Medical History  Diagnosis Date  . Kidney stones   . Herpes     anal  . Polycystic ovarian disease   . Asthma   . Headache(784.0)   . Hx of chlamydia infection   . History of PCOS   . Depression 11/25/2012  . Dyslipidemia 12/31/2012  . Vaginal itching 03/02/2014  . Yeast infection 03/02/2014  . Kidney stones   . Diabetes mellitus without complication [facility](HCC)    Past Surgical History  Procedure Laterality Date  . Inner ear surgery    . Arm hardware removal    . Orif wrist fracture     Family History  Problem Relation Age of Onset  . Hypertension Mother   . Diabetes Mother   . Depression Mother   . Mental illness Mother   . Diabetes Father   . Heart attack Maternal Grandmother   . Heart disease Maternal Grandmother   . Diabetes Maternal Grandmother    . Diabetes Maternal Grandfather   . Diabetes Paternal Grandmother   . Diabetes Paternal Grandfather    Social History  Substance Use Topics  . Smoking status: Never Smoker   . Smokeless tobacco: Never Used  . Alcohol Use: Yes     Comment: occ   OB History    No data available     Review of Systems  Constitutional: Negative for fever.  Respiratory: Negative for shortness of breath.   Cardiovascular: Negative for chest pain and leg swelling.  Gastrointestinal: Negative for abdominal pain, constipation, abdominal distention and bowel incontinence.  Genitourinary: Negative for dysuria, urgency, frequency, flank pain and difficulty urinating.  Musculoskeletal: Positive for back pain. Negative for joint swelling and gait problem.  Skin: Negative for rash.  Neurological: Negative for tingling, weakness, numbness and paresthesias.      Allergies  Penicillins; Bee venom; Flexeril; Hydrocodone-acetaminophen; and Other  Home Medications   Prior to Admission medications   Medication Sig Start Date End Date Taking? Authorizing Provider  oxyCODONE-acetaminophen (PERCOCET/ROXICET) 5-325 MG tablet Take 1 tablet by mouth every 4 (four) hours as needed for severe pain.   Yes Historical Provider, MD  methocarbamol (ROBAXIN) 500 MG tablet Take 2 tablets (1,000 mg total) by mouth 4 (four) times daily. 10/15/15 10/25/15  Burgess AmorJulie Jonta Gastineau, PA-C   BP 136/86 mmHg  Pulse 85  Temp(Src) 98.5 F (36.9 C) (Oral)  Resp 16  Ht  (1.854 m)  Wt 145.151 kg  BMI 42.23 kg/m2  SpO2 100%  LMP 10/14/2015 Physical Exam  Constitutional: She appears well-developed and well-nourished.  HENT:  Head: Normocephalic.  Eyes: Conjunctivae are normal.  Neck: Normal range of motion. Neck supple.  Cardiovascular: Normal rate and intact distal pulses.   Pedal pulses normal.  Pulmonary/Chest: Effort normal.  Abdominal: Soft. Bowel sounds are normal. She exhibits no distension and no mass.  Musculoskeletal: Normal  range of motion. She exhibits no edema.       Lumbar back: She exhibits tenderness. She exhibits no swelling, no edema and no spasm.  Neurological: She is alert. She has normal strength. She displays no atrophy and no tremor. No sensory deficit. Gait normal.  Reflex Scores:      Patellar reflexes are 2+ on the right side and 2+ on the left side.      Achilles reflexes are 2+ on the right side and 2+ on the left side. No strength deficit noted in hip and knee flexor and extensor muscle groups.  Ankle flexion and extension intact.  Skin: Skin is warm and dry.  Psychiatric: She has a normal mood and affect.  Nursing note and vitals reviewed.   ED Course  Procedures (including critical care time) Labs Review Labs Reviewed  POC URINE PREG, ED    Imaging Review No results found. I have personally reviewed and evaluated these images and lab results as part of my medical decision-making.   EKG Interpretation None      MDM   Final diagnoses:  Lumbosacral strain, subsequent encounter    Pt with no relief of pain from percocet and flexeril, stopped flex as it was making her itch (states has had percocet in the past without itching so knows it was the flexeril).  Imaging reviewed and negative for acute bony injury. Well maintained disc spaces. Prescribed robaxin in place of flexeril. Advised can add ibuprofen as well.  Heat tx. F/u with pcp (lives in [place]) prn if sx persist or worsen.  No neuro deficit on exam or by history to suggest emergent or surgical presentation.  Discussed worsened sx that should prompt immediate re-evaluation including distal weakness, bowel/bladder retention/incontinence.         Burgess Amor, PA-C 10/17/15 2208  Samuel Jester, DO 10/18/15 [phone]

## 2016-08-26 IMAGING — CR DG CHEST 2V
2 series · 2 of 2 positions shown · non-contrast
Comparison: 07/18/2012

CLINICAL DATA: Cough, congestion and fever with pain.

EXAM:
CHEST  2 VIEW

[view not recorded (1 of 2)]
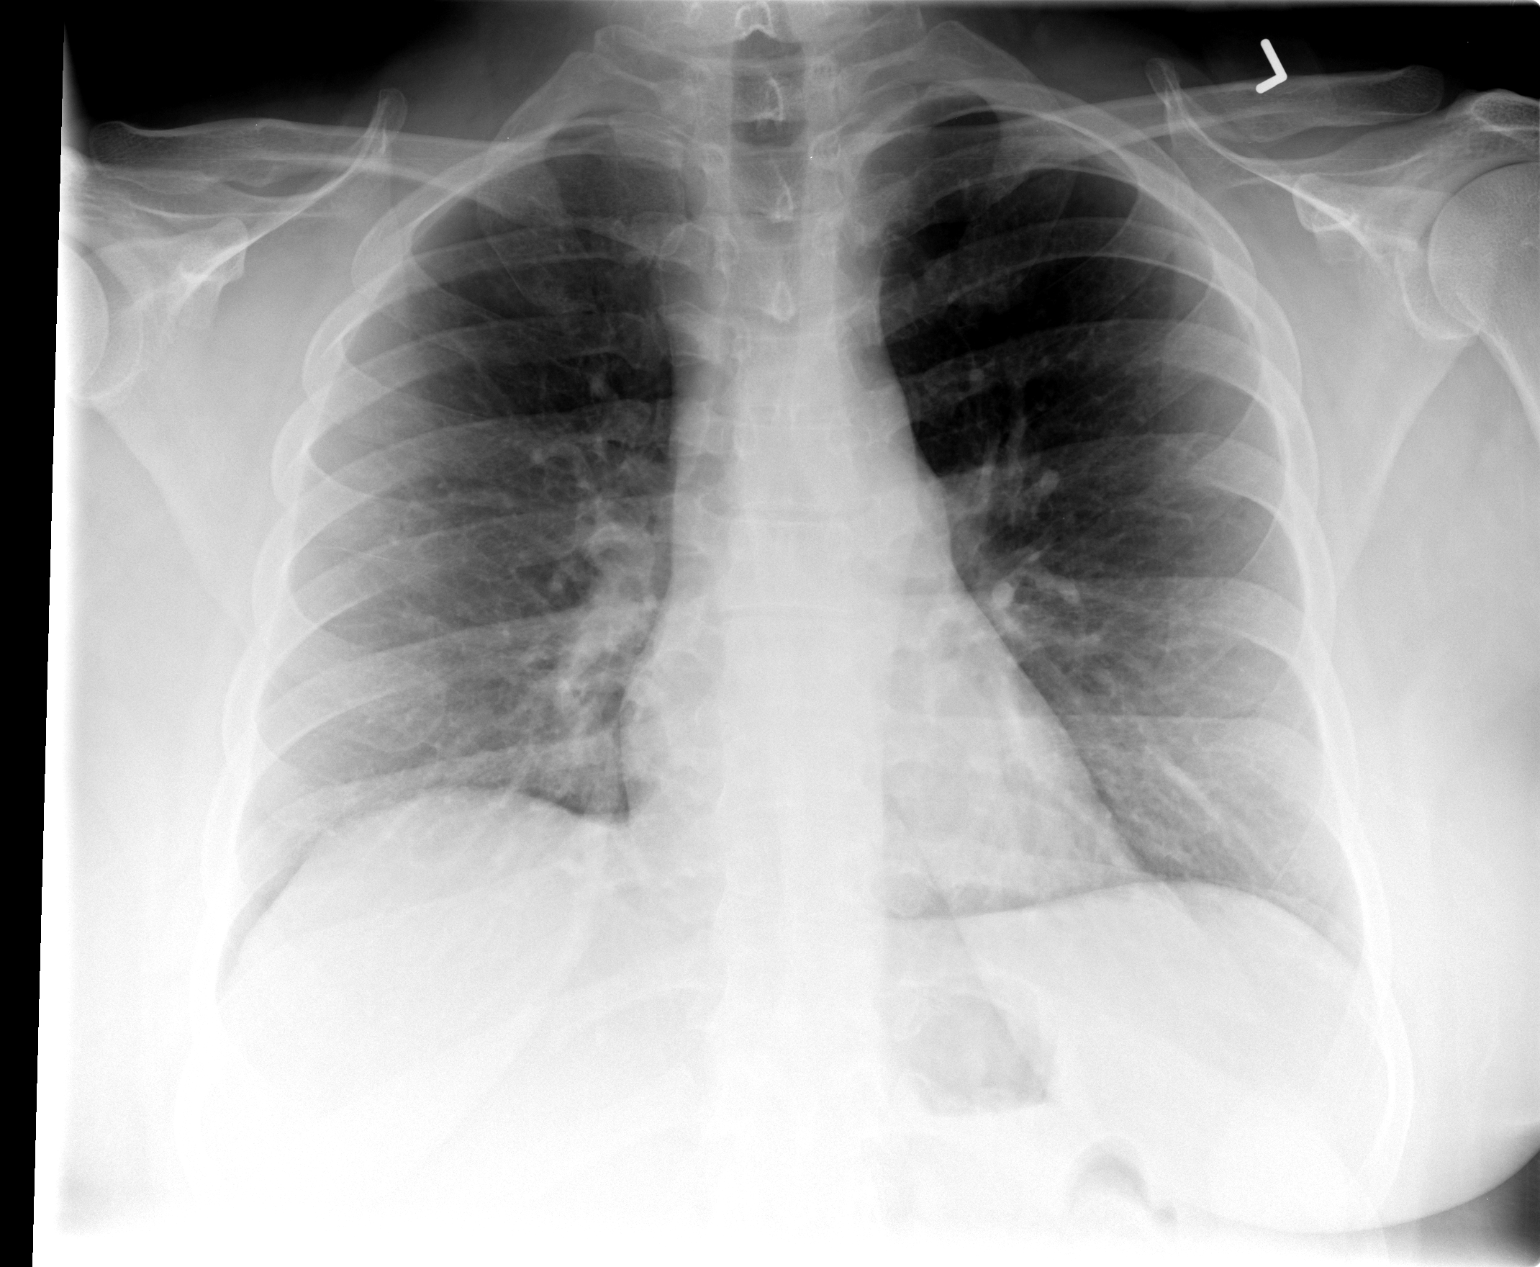

[view not recorded (2 of 2)]
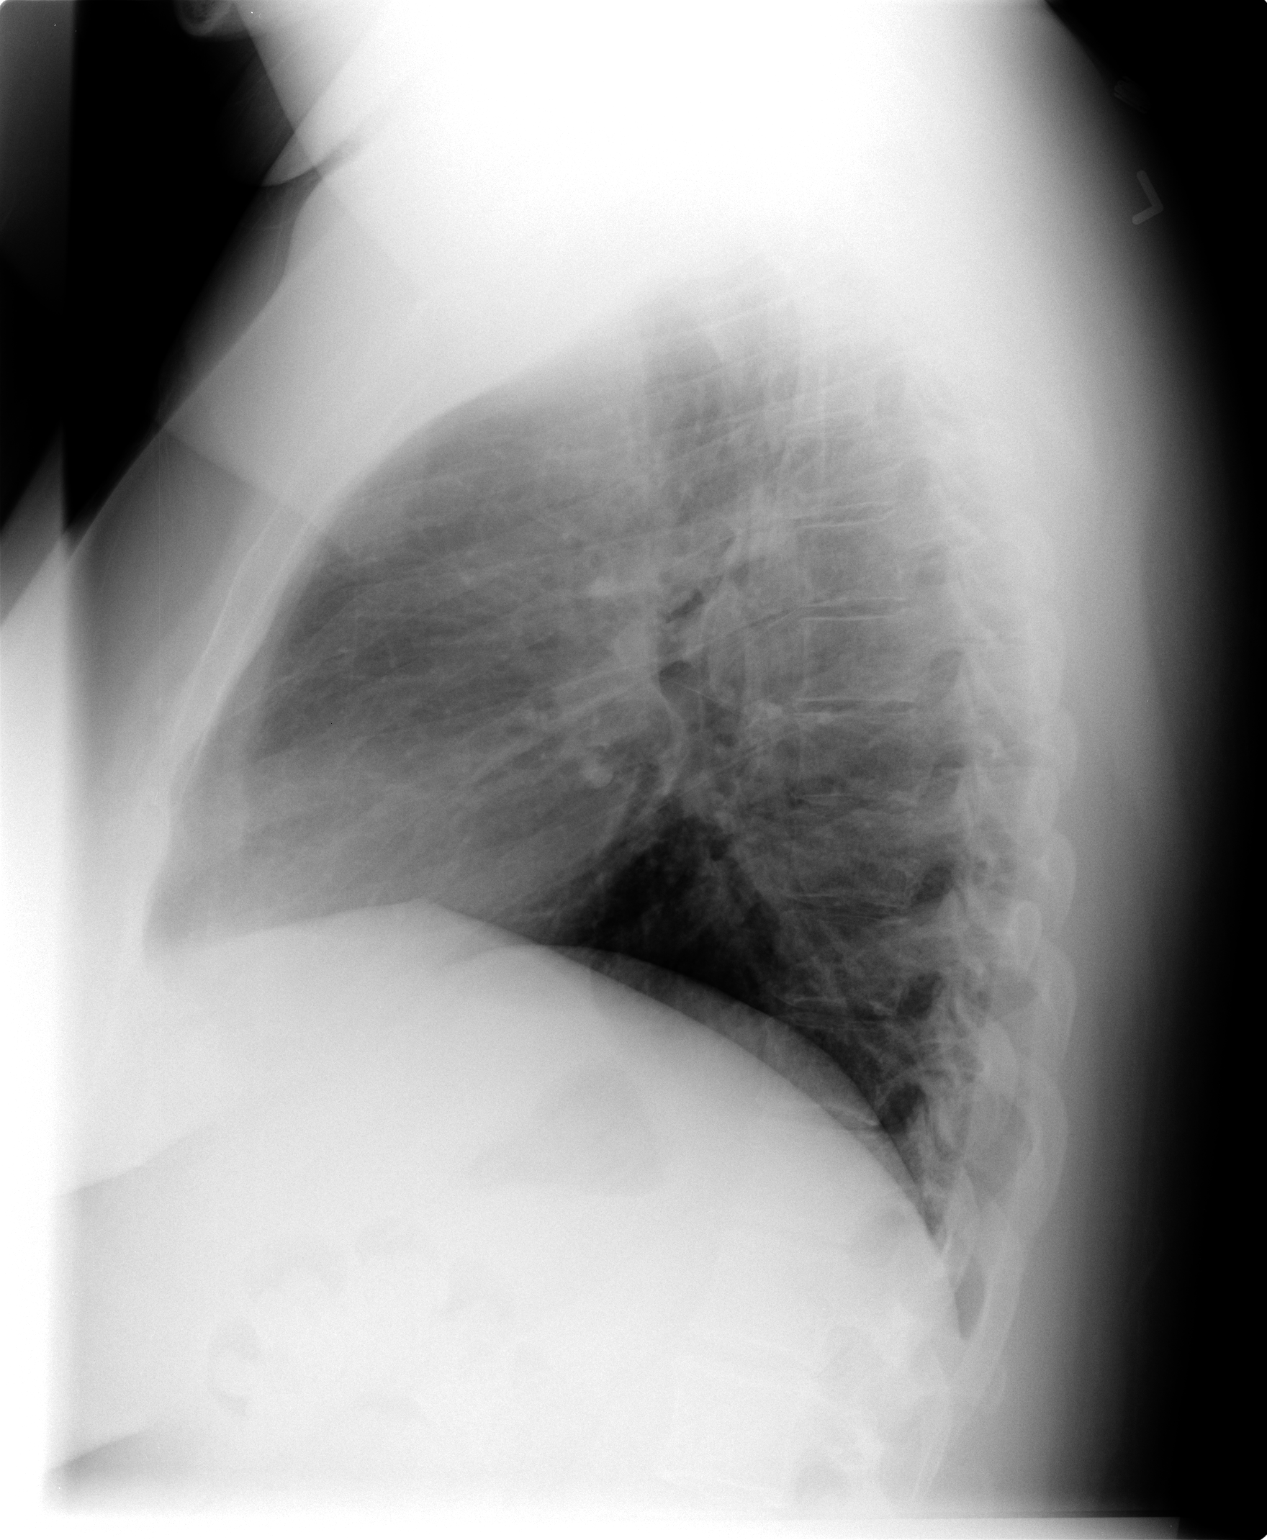

[2 of 2 positions shown; findings below may reference images not displayed]

FINDINGS: Lungs are adequately inflated and otherwise clear. Cardiomediastinal
silhouette and remainder of the exam is unchanged.
IMPRESSION: No active cardiopulmonary disease.

## 2016-10-07 ENCOUNTER — Emergency Department (HOSPITAL_COMMUNITY)
Admission: EM | Admit: 2016-10-07 | Discharge: 2016-10-07 | Disposition: A | Discharge status: Home / Self Care | Payer: Managed Care, Other (non HMO) | Attending: Emergency Medicine | Admitting: Emergency Medicine

## 2016-10-07 ENCOUNTER — Encounter (HOSPITAL_COMMUNITY): Payer: Self-pay | Admitting: *Deleted

## 2016-10-07 DIAGNOSIS — N10 Acute tubulo-interstitial nephritis: Secondary | ICD-10-CM | POA: Diagnosis not present

## 2016-10-07 DIAGNOSIS — J45909 Unspecified asthma, uncomplicated: Secondary | ICD-10-CM | POA: Diagnosis not present

## 2016-10-07 DIAGNOSIS — E119 Type 2 diabetes mellitus without complications: Secondary | ICD-10-CM | POA: Diagnosis not present

## 2016-10-07 DIAGNOSIS — R109 Unspecified abdominal pain: Secondary | ICD-10-CM | POA: Diagnosis present

## 2016-10-07 LAB — CBC WITH DIFFERENTIAL/PLATELET
Basophils Absolute: 0 10*3/uL (ref 0.0–0.1)
Basophils Relative: 0 %
EOS PCT: 1 %
Eosinophils Absolute: 0.1 10*3/uL (ref 0.0–0.7)
HEMATOCRIT: 41.1 % (ref 36.0–46.0)
HEMOGLOBIN: 14.2 g/dL (ref 12.0–15.0)
LYMPHS ABS: 4.1 10*3/uL — AB (ref 0.7–4.0)
LYMPHS PCT: 46 %
MCH: 29.6 pg (ref 26.0–34.0)
MCHC: 34.5 g/dL (ref 30.0–36.0)
MCV: 85.6 fL (ref 78.0–100.0)
Monocytes Absolute: 0.7 10*3/uL (ref 0.1–1.0)
Monocytes Relative: 8 %
NEUTROS ABS: 4 10*3/uL (ref 1.7–7.7)
NEUTROS PCT: 45 %
Platelets: 229 10*3/uL (ref 150–400)
RBC: 4.8 MIL/uL (ref 3.87–5.11)
RDW: 13.1 % (ref 11.5–15.5)
WBC: 9 10*3/uL (ref 4.0–10.5)

## 2016-10-07 LAB — URINALYSIS, ROUTINE W REFLEX MICROSCOPIC
Bilirubin Urine: NEGATIVE
Glucose, UA: >=500 mg/dL — AB
HGB URINE DIPSTICK: NEGATIVE
Ketones, ur: 5 mg/dL — AB
Nitrite: POSITIVE — AB
Protein, ur: NEGATIVE mg/dL
SPECIFIC GRAVITY, URINE: 1.018 (ref 1.005–1.030)
pH: 5 (ref 5.0–8.0)

## 2016-10-07 LAB — I-STAT BETA HCG BLOOD, ED (MC, WL, AP ONLY)

## 2016-10-07 LAB — COMPREHENSIVE METABOLIC PANEL
ALK PHOS: 55 U/L (ref 38–126)
ALT: 36 U/L (ref 14–54)
AST: 31 U/L (ref 15–41)
Albumin: 4.3 g/dL (ref 3.5–5.0)
Anion gap: 8 (ref 5–15)
BUN: 15 mg/dL (ref 6–20)
CALCIUM: 9.7 mg/dL (ref 8.9–10.3)
CO2: 24 mmol/L (ref 22–32)
CREATININE: 0.67 mg/dL (ref 0.44–1.00)
Chloride: 107 mmol/L (ref 101–111)
Glucose, Bld: 168 mg/dL — ABNORMAL HIGH (ref 65–99)
Potassium: 3.7 mmol/L (ref 3.5–5.1)
Sodium: 139 mmol/L (ref 135–145)
Total Bilirubin: 0.5 mg/dL (ref 0.3–1.2)
Total Protein: 8.3 g/dL — ABNORMAL HIGH (ref 6.5–8.1)

## 2016-10-07 MED ORDER — DEXTROSE 5 % IV SOLN
1.0000 g | Freq: Once | INTRAVENOUS | Status: AC
  Administered 2016-10-07: 1 g via INTRAVENOUS
  Filled 2016-10-07: qty 10

## 2016-10-07 MED ORDER — HYDROMORPHONE HCL 1 MG/ML IJ SOLN
0.5000 mg | Freq: Once | INTRAMUSCULAR | Status: AC
  Administered 2016-10-07: 0.5 mg via INTRAVENOUS
  Filled 2016-10-07: qty 1

## 2016-10-07 MED ORDER — CEPHALEXIN 500 MG PO CAPS: 500.0000 mg | ORAL_CAPSULE | Freq: Four times a day (QID) | ORAL | 0 refills | Status: AC

## 2016-10-07 MED ORDER — TRAMADOL HCL 50 MG PO TABS: 50.0000 mg | ORAL_TABLET | Freq: Four times a day (QID) | ORAL | 0 refills | Status: AC | PRN

## 2016-10-07 MED ORDER — ONDANSETRON HCL 4 MG/2ML IJ SOLN
4.0000 mg | Freq: Once | INTRAMUSCULAR | Status: AC
  Administered 2016-10-07: 4 mg via INTRAVENOUS
  Filled 2016-10-07: qty 2

## 2016-10-07 MED ORDER — ONDANSETRON 4 MG PO TBDP: ORAL_TABLET | ORAL | 0 refills | Status: AC

## 2016-10-07 MED ORDER — KETOROLAC TROMETHAMINE 30 MG/ML IJ SOLN
30.0000 mg | Freq: Once | INTRAMUSCULAR | Status: AC
  Administered 2016-10-07: 30 mg via INTRAVENOUS
  Filled 2016-10-07: qty 1

## 2016-10-07 NOTE — ED Triage Notes (Signed)
Pt comes in with right flank pain that moves into right groin. This started 2 days ago. Pt states she is having frequency. Pt has nausea, denies vomiting.

## 2016-10-07 NOTE — ED Provider Notes (Signed)
[facility]-EMERGENCY DEPT Provider Note   CSN: [id number] Arrival date & time: 10/07/16  1755     History   Chief Complaint Chief Complaint  Patient presents with  . Flank Pain    HPI Rachael Collier is a 31 y.o. female.  Patient complains of right flank pain.   The history is provided by the patient. No language interpreter was used.  Flank Pain  This is a new problem. The current episode started more than 2 days ago. The problem occurs constantly. The problem has not changed since onset.Pertinent negatives include no chest pain, no abdominal pain and no headaches. Nothing aggravates the symptoms.    Past Medical History:  Diagnosis Date  . Asthma   . Depression 11/25/2012  . Diabetes mellitus without complication (HCC)   . Dyslipidemia 12/31/2012  . Headache(784.0)   . Herpes    anal  . History of PCOS   . Hx of chlamydia infection   . Kidney stones   . Kidney stones   . Polycystic ovarian disease   . Vaginal itching 03/02/2014  . Yeast infection 03/02/2014    Patient Active Problem List   Diagnosis Date Noted  . Screening for STD (sexually transmitted disease) 05/13/2014  . Vaginal itching 03/02/2014  . Yeast infection 03/02/2014  . Routine gynecological examination 02/25/2014  . Dyslipidemia 12/31/2012  . Diabetes (HCC) 11/25/2012  . Depression 11/25/2012  . Asthma 11/21/2012  . VULVOVAGINITIS 03/09/2009  . OTHER SPECIFIED DISEASE OF NAIL 01/06/2009  . ACNE VULGARIS 01/06/2009  . FLANK PAIN, LEFT 12/21/2008  . STREPTOCOCCAL PHARYNGITIS 12/11/2008  . MENORRHAGIA 06/26/2008  . GENITAL HERPES 06/12/2008  . Morbid obesity (HCC) 06/12/2008  . DECREASED HEARING, BILATERAL 06/12/2008  . ALLERGIC RHINITIS 06/12/2008  . RENAL CALCULUS, HX OF 06/12/2008    Past Surgical History:  Procedure Laterality Date  . ARM HARDWARE REMOVAL    . INNER EAR SURGERY    . ORIF WRIST FRACTURE      OB History    No data available       Home Medications    Prior to  Admission medications   Medication Sig Start Date End Date Taking? Authorizing Provider  cephALEXin (KEFLEX) 500 MG capsule Take 1 capsule (500 mg total) by mouth 4 (four) times daily. 10/07/16   Bethann BerkshireJoseph Ysabela Keisler, MD  ondansetron (ZOFRAN ODT) 4 MG disintegrating tablet 4mg  ODT q4 hours prn nausea/vomit 10/07/16   Bethann BerkshireJoseph Motty Borin, MD  traMADol (ULTRAM) 50 MG tablet Take 1 tablet (50 mg total) by mouth every 6 (six) hours as needed. 10/07/16   Bethann BerkshireJoseph Galadriel Shroff, MD    Family History Family History  Problem Relation Age of Onset  . Hypertension Mother   . Diabetes Mother   . Depression Mother   . Mental illness Mother   . Diabetes Father   . Heart attack Maternal Grandmother   . Heart disease Maternal Grandmother   . Diabetes Maternal Grandmother   . Diabetes Maternal Grandfather   . Diabetes Paternal Grandmother   . Diabetes Paternal Grandfather     Social History Social History  Substance Use Topics  . Smoking status: Never Smoker  . Smokeless tobacco: Never Used  . Alcohol use Yes     Comment: occ     Allergies   Penicillins; Bee venom; Ciprofloxacin; Flexeril [cyclobenzaprine]; Hydrocodone-acetaminophen; and Other   Review of Systems Review of Systems  Constitutional: Negative for appetite change and fatigue.  HENT: Negative for congestion, ear discharge and sinus pressure.   Eyes:  Negative for discharge.  Respiratory: Negative for cough.   Cardiovascular: Negative for chest pain.  Gastrointestinal: Negative for abdominal pain and diarrhea.  Genitourinary: Positive for flank pain. Negative for frequency and hematuria.  Musculoskeletal: Negative for back pain.  Skin: Negative for rash.  Neurological: Negative for seizures and headaches.  Psychiatric/Behavioral: Negative for hallucinations.     Physical Exam Updated Vital Signs BP 137/77 (BP Location: Right Arm)   Pulse 82   Temp 99.3 F (37.4 C) (Oral)   Resp 18   Ht 6\' 1"  (1.854 m)   Wt (!) 316 lb (143.3 kg)   LMP  09/11/2016   SpO2 99%   BMI 41.69 kg/m   Physical Exam  Constitutional: She is oriented to person, place, and time. She appears well-developed.  HENT:  Head: Normocephalic.  Eyes: Conjunctivae and EOM are normal. No scleral icterus.  Neck: Neck supple. No thyromegaly present.  Cardiovascular: Normal rate and regular rhythm.  Exam reveals no gallop and no friction rub.   No murmur heard. Pulmonary/Chest: No stridor. She has no wheezes. She has no rales. She exhibits no tenderness.  Abdominal: She exhibits no distension. There is no tenderness. There is no rebound.  Musculoskeletal: Normal range of motion. She exhibits no edema.  Tenderness to right flank.  Lymphadenopathy:    She has no cervical adenopathy.  Neurological: She is oriented to person, place, and time. She exhibits normal muscle tone. Coordination normal.  Skin: No rash noted. No erythema.  Psychiatric: She has a normal mood and affect. Her behavior is normal.     ED Treatments / Results  Labs (all labs ordered are listed, but only abnormal results are displayed) Labs Reviewed  URINALYSIS, ROUTINE W REFLEX MICROSCOPIC - Abnormal; Notable for the following:       Result Value   APPearance CLOUDY (*)    Glucose, UA >=500 (*)    Ketones, ur 5 (*)    Nitrite POSITIVE (*)    Leukocytes, UA LARGE (*)    Bacteria, UA MANY (*)    All other components within normal limits  CBC WITH DIFFERENTIAL/PLATELET - Abnormal; Notable for the following:    Lymphs Abs 4.1 (*)    All other components within normal limits  COMPREHENSIVE METABOLIC PANEL - Abnormal; Notable for the following:    Glucose, Bld 168 (*)    Total Protein 8.3 (*)    All other components within normal limits  URINE CULTURE  I-STAT BETA HCG BLOOD, ED ([facility], [facility], [facility] ONLY)    EKG  EKG Interpretation None       Radiology No results found.  Procedures Procedures (including critical care time)  Medications Ordered in ED Medications  cefTRIAXone  (ROCEPHIN) 1 g in dextrose 5 % 50 mL IVPB (not administered)  HYDROmorphone (DILAUDID) injection 0.5 mg (not administered)  ondansetron (ZOFRAN) injection 4 mg (not administered)  ketorolac (TORADOL) 30 MG/ML injection 30 mg (30 mg Intravenous Given 10/07/16 1902)  ondansetron (ZOFRAN) injection 4 mg (4 mg Intravenous Given 10/07/16 1902)     Initial Impression / Assessment and Plan / ED Course  I have reviewed the triage vital signs and the nursing notes.  Pertinent labs & imaging results that were available during my care of the patient were reviewed by me and considered in my medical decision making (see chart for details).     Patient with urinary tract infection. She'll be sent home with Keflex and Ultram and Zofran  Final Clinical Impressions(s) / ED Diagnoses  Final diagnoses:  Acute pyelonephritis    New Prescriptions New Prescriptions   CEPHALEXIN (KEFLEX) 500 MG CAPSULE    Take 1 capsule (500 mg total) by mouth 4 (four) times daily.   ONDANSETRON (ZOFRAN ODT) 4 MG DISINTEGRATING TABLET    4mg  ODT q4 hours prn nausea/vomit   TRAMADOL (ULTRAM) 50 MG TABLET    Take 1 tablet (50 mg total) by mouth every 6 (six) hours as needed.     Bethann Berkshire, MD 10/07/16 2114

## 2016-10-07 NOTE — Discharge Instructions (Signed)
Follow-up with your family doctor or Dr. Emelda FearFerguson when antibiotic is done

## 2016-10-11 LAB — URINE CULTURE: Culture: >=100000 — AB

## 2016-10-12 ENCOUNTER — Telehealth: Payer: Self-pay | Admitting: *Deleted

## 2016-10-12 NOTE — Telephone Encounter (Signed)
Post ED Visit - Positive Culture Follow-up  Culture report reviewed by antimicrobial stewardship pharmacist:  []  [name], Pharm.D. []  [name], Pharm.D., BCPS []  [name], Pharm.D. []  [name], Pharm.D., BCPS []  [name], [place]D., BCPS, AAHIVP [x]  [name], Pharm.D., BCPS, AAHIVP []  [name], [address]Pharm[address] []  [name], [place]arm[place]D.  Positive urine culture Treated with Cephalexin,  organism sensitive to the same and no further patient follow-up is required at this time.  Virl AxeRobertson, Kelijah Towry [facility] 10/12/2016, 11:45 AM
